# Patient Record
Sex: Female | Born: 1970 | Race: White | Hispanic: No | Smoking: Former smoker
Health system: Southern US, Community
[De-identification: ages and names within clinical notes are randomized; demographics above are authoritative.]

## PROBLEM LIST (undated history)

## (undated) DIAGNOSIS — J40 Bronchitis, not specified as acute or chronic: Secondary | ICD-10-CM

## (undated) DIAGNOSIS — IMO0002 Reserved for concepts with insufficient information to code with codable children: Secondary | ICD-10-CM

## (undated) DIAGNOSIS — M797 Fibromyalgia: Secondary | ICD-10-CM

## (undated) DIAGNOSIS — F410 Panic disorder [episodic paroxysmal anxiety] without agoraphobia: Secondary | ICD-10-CM

## (undated) DIAGNOSIS — K639 Disease of intestine, unspecified: Secondary | ICD-10-CM

## (undated) DIAGNOSIS — G713 Mitochondrial myopathy, not elsewhere classified: Secondary | ICD-10-CM

## (undated) DIAGNOSIS — Z9010 Acquired absence of unspecified breast and nipple: Secondary | ICD-10-CM

## (undated) DIAGNOSIS — F329 Major depressive disorder, single episode, unspecified: Secondary | ICD-10-CM

## (undated) DIAGNOSIS — K219 Gastro-esophageal reflux disease without esophagitis: Secondary | ICD-10-CM

## (undated) DIAGNOSIS — M3320 Polymyositis, organ involvement unspecified: Secondary | ICD-10-CM

## (undated) DIAGNOSIS — F3181 Bipolar II disorder: Secondary | ICD-10-CM

## (undated) DIAGNOSIS — M6282 Rhabdomyolysis: Secondary | ICD-10-CM

## (undated) DIAGNOSIS — S134XXA Sprain of ligaments of cervical spine, initial encounter: Secondary | ICD-10-CM

## (undated) DIAGNOSIS — J45909 Unspecified asthma, uncomplicated: Secondary | ICD-10-CM

## (undated) DIAGNOSIS — I10 Essential (primary) hypertension: Secondary | ICD-10-CM

## (undated) DIAGNOSIS — C50919 Malignant neoplasm of unspecified site of unspecified female breast: Secondary | ICD-10-CM

## (undated) DIAGNOSIS — I200 Unstable angina: Secondary | ICD-10-CM

## (undated) DIAGNOSIS — F3281 Premenstrual dysphoric disorder: Secondary | ICD-10-CM

## (undated) DIAGNOSIS — E669 Obesity, unspecified: Secondary | ICD-10-CM

## (undated) DIAGNOSIS — D649 Anemia, unspecified: Secondary | ICD-10-CM

## (undated) DIAGNOSIS — K589 Irritable bowel syndrome without diarrhea: Secondary | ICD-10-CM

## (undated) DIAGNOSIS — C4491 Basal cell carcinoma of skin, unspecified: Secondary | ICD-10-CM

## (undated) DIAGNOSIS — Z8614 Personal history of Methicillin resistant Staphylococcus aureus infection: Secondary | ICD-10-CM

## (undated) DIAGNOSIS — E785 Hyperlipidemia, unspecified: Secondary | ICD-10-CM

## (undated) DIAGNOSIS — F4310 Post-traumatic stress disorder, unspecified: Secondary | ICD-10-CM

## (undated) DIAGNOSIS — T148XXA Other injury of unspecified body region, initial encounter: Secondary | ICD-10-CM

## (undated) DIAGNOSIS — J45990 Exercise induced bronchospasm: Secondary | ICD-10-CM

## (undated) HISTORY — DX: Major depressive disorder, single episode, unspecified: F32.9

## (undated) HISTORY — DX: Gastro-esophageal reflux disease without esophagitis: K21.9

## (undated) HISTORY — DX: Disease of intestine, unspecified: K63.9

## (undated) HISTORY — DX: Obesity, unspecified: E66.9

## (undated) HISTORY — DX: Post-traumatic stress disorder, unspecified: F43.10

## (undated) HISTORY — DX: Premenstrual dysphoric disorder: F32.81

## (undated) HISTORY — DX: Reserved for concepts with insufficient information to code with codable children: IMO0002

## (undated) HISTORY — DX: Fibromyalgia: M79.7

## (undated) HISTORY — DX: Malignant neoplasm of unspecified site of unspecified female breast: C50.919

## (undated) HISTORY — DX: Bipolar II disorder: F31.81

## (undated) HISTORY — DX: Sprain of ligaments of cervical spine, initial encounter: S13.4XXA

## (undated) HISTORY — PX: MASTECTOMY: SHX3

## (undated) HISTORY — DX: Exercise induced bronchospasm: J45.990

## (undated) HISTORY — DX: Bronchitis, not specified as acute or chronic: J40

## (undated) HISTORY — PX: MUSCLE BIOPSY: SHX716

## (undated) HISTORY — PX: NASAL SINUS SURGERY: SHX719

## (undated) HISTORY — DX: Acquired absence of unspecified breast and nipple: Z90.10

## (undated) HISTORY — DX: Irritable bowel syndrome without diarrhea: K58.9

## (undated) HISTORY — PX: NASAL POLYP SURGERY: SHX186

## (undated) HISTORY — DX: Basal cell carcinoma of skin, unspecified: C44.91

## (undated) HISTORY — DX: Other injury of unspecified body region, initial encounter: T14.8XXA

## (undated) HISTORY — PX: ABDOMINAL HYSTERECTOMY: SHX81

## (undated) HISTORY — DX: Panic disorder (episodic paroxysmal anxiety): F41.0

---
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
## 1991-08-04 DIAGNOSIS — T148XXA Other injury of unspecified body region, initial encounter: Secondary | ICD-10-CM

## 1991-08-04 HISTORY — DX: Other injury of unspecified body region, initial encounter: T14.8XXA

## 1992-08-03 DIAGNOSIS — K639 Disease of intestine, unspecified: Secondary | ICD-10-CM

## 1992-08-03 HISTORY — DX: Disease of intestine, unspecified: K63.9

## 1998-08-03 DIAGNOSIS — J40 Bronchitis, not specified as acute or chronic: Secondary | ICD-10-CM

## 1998-08-03 HISTORY — DX: Bronchitis, not specified as acute or chronic: J40

## 2000-09-03 HISTORY — PX: LAPAROSCOPIC ENDOMETRIOSIS FULGURATION: SUR769

## 2001-08-31 ENCOUNTER — Ambulatory Visit (HOSPITAL_COMMUNITY): Admission: RE | Admit: 2001-08-31 | Discharge: 2001-08-31 | Payer: Self-pay | Admitting: Gastroenterology

## 2002-05-16 ENCOUNTER — Other Ambulatory Visit: Admission: RE | Admit: 2002-05-16 | Discharge: 2002-05-16 | Payer: Self-pay | Admitting: Obstetrics and Gynecology

## 2003-04-26 ENCOUNTER — Other Ambulatory Visit: Admission: RE | Admit: 2003-04-26 | Discharge: 2003-04-26 | Payer: Self-pay | Admitting: Obstetrics & Gynecology

## 2003-08-04 DIAGNOSIS — S134XXA Sprain of ligaments of cervical spine, initial encounter: Secondary | ICD-10-CM

## 2003-08-04 HISTORY — DX: Sprain of ligaments of cervical spine, initial encounter: S13.4XXA

## 2005-06-30 ENCOUNTER — Ambulatory Visit (HOSPITAL_COMMUNITY): Admission: RE | Admit: 2005-06-30 | Discharge: 2005-06-30 | Payer: Self-pay | Admitting: Gynecology

## 2005-07-16 ENCOUNTER — Ambulatory Visit (HOSPITAL_COMMUNITY): Admission: RE | Admit: 2005-07-16 | Discharge: 2005-07-16 | Payer: Self-pay | Admitting: Gynecology

## 2005-08-03 DIAGNOSIS — C4491 Basal cell carcinoma of skin, unspecified: Secondary | ICD-10-CM

## 2005-08-03 HISTORY — DX: Basal cell carcinoma of skin, unspecified: C44.91

## 2005-10-01 ENCOUNTER — Inpatient Hospital Stay (HOSPITAL_COMMUNITY): Admission: AD | Admit: 2005-10-01 | Discharge: 2005-10-01 | Payer: Self-pay | Admitting: Gynecology

## 2005-10-06 ENCOUNTER — Ambulatory Visit: Payer: Self-pay | Admitting: Family Medicine

## 2005-10-20 ENCOUNTER — Ambulatory Visit: Payer: Self-pay | Admitting: Family Medicine

## 2005-11-03 ENCOUNTER — Ambulatory Visit: Payer: Self-pay | Admitting: Family Medicine

## 2005-11-12 ENCOUNTER — Ambulatory Visit: Payer: Self-pay | Admitting: Obstetrics & Gynecology

## 2005-11-19 ENCOUNTER — Ambulatory Visit: Payer: Self-pay | Admitting: Obstetrics & Gynecology

## 2005-11-23 ENCOUNTER — Ambulatory Visit: Payer: Self-pay | Admitting: Gynecology

## 2005-11-26 ENCOUNTER — Ambulatory Visit: Payer: Self-pay | Admitting: Obstetrics & Gynecology

## 2005-12-03 ENCOUNTER — Ambulatory Visit: Payer: Self-pay | Admitting: Obstetrics & Gynecology

## 2005-12-06 ENCOUNTER — Ambulatory Visit: Payer: Self-pay | Admitting: Gynecology

## 2005-12-06 ENCOUNTER — Inpatient Hospital Stay (HOSPITAL_COMMUNITY): Admission: AD | Admit: 2005-12-06 | Discharge: 2005-12-13 | Payer: Self-pay | Admitting: Family Medicine

## 2005-12-08 ENCOUNTER — Encounter (INDEPENDENT_AMBULATORY_CARE_PROVIDER_SITE_OTHER): Payer: Self-pay | Admitting: *Deleted

## 2005-12-17 ENCOUNTER — Ambulatory Visit: Payer: Self-pay | Admitting: Family Medicine

## 2006-01-14 ENCOUNTER — Ambulatory Visit: Payer: Self-pay | Admitting: Obstetrics & Gynecology

## 2006-04-01 ENCOUNTER — Encounter: Payer: Self-pay | Admitting: Internal Medicine

## 2006-04-02 ENCOUNTER — Encounter: Admission: RE | Admit: 2006-04-02 | Discharge: 2006-04-02 | Payer: Self-pay | Admitting: Neurology

## 2006-05-20 ENCOUNTER — Encounter: Payer: Self-pay | Admitting: Internal Medicine

## 2006-05-27 ENCOUNTER — Ambulatory Visit (HOSPITAL_BASED_OUTPATIENT_CLINIC_OR_DEPARTMENT_OTHER): Admission: RE | Admit: 2006-05-27 | Discharge: 2006-05-27 | Payer: Self-pay | Admitting: Rheumatology

## 2006-05-30 ENCOUNTER — Ambulatory Visit: Payer: Self-pay | Admitting: Internal Medicine

## 2006-07-03 ENCOUNTER — Encounter: Admission: RE | Admit: 2006-07-03 | Discharge: 2006-07-03 | Payer: Self-pay | Admitting: Neurology

## 2006-08-03 DIAGNOSIS — IMO0002 Reserved for concepts with insufficient information to code with codable children: Secondary | ICD-10-CM

## 2006-08-03 DIAGNOSIS — K589 Irritable bowel syndrome without diarrhea: Secondary | ICD-10-CM

## 2006-08-03 HISTORY — DX: Irritable bowel syndrome, unspecified: K58.9

## 2006-08-03 HISTORY — PX: COLONOSCOPY: SHX174

## 2006-08-03 HISTORY — DX: Reserved for concepts with insufficient information to code with codable children: IMO0002

## 2006-10-02 ENCOUNTER — Ambulatory Visit: Payer: Self-pay | Admitting: Internal Medicine

## 2006-10-14 ENCOUNTER — Ambulatory Visit: Payer: Self-pay | Admitting: Internal Medicine

## 2006-11-02 ENCOUNTER — Ambulatory Visit: Payer: Self-pay | Admitting: Internal Medicine

## 2006-11-05 ENCOUNTER — Ambulatory Visit: Payer: Self-pay | Admitting: Unknown Physician Specialty

## 2006-12-02 ENCOUNTER — Ambulatory Visit: Payer: Self-pay | Admitting: Internal Medicine

## 2007-05-11 ENCOUNTER — Observation Stay: Payer: Self-pay | Admitting: Internal Medicine

## 2007-05-11 ENCOUNTER — Other Ambulatory Visit: Payer: Self-pay

## 2007-05-12 ENCOUNTER — Encounter: Payer: Self-pay | Admitting: Internal Medicine

## 2007-05-17 ENCOUNTER — Ambulatory Visit: Payer: Self-pay | Admitting: Professional

## 2007-05-24 ENCOUNTER — Ambulatory Visit: Payer: Self-pay | Admitting: Professional

## 2007-05-25 ENCOUNTER — Ambulatory Visit: Payer: Self-pay | Admitting: Internal Medicine

## 2007-05-25 DIAGNOSIS — J45990 Exercise induced bronchospasm: Secondary | ICD-10-CM | POA: Insufficient documentation

## 2007-05-25 DIAGNOSIS — K219 Gastro-esophageal reflux disease without esophagitis: Secondary | ICD-10-CM | POA: Insufficient documentation

## 2007-05-25 DIAGNOSIS — F3289 Other specified depressive episodes: Secondary | ICD-10-CM | POA: Insufficient documentation

## 2007-05-25 DIAGNOSIS — IMO0001 Reserved for inherently not codable concepts without codable children: Secondary | ICD-10-CM | POA: Insufficient documentation

## 2007-05-25 DIAGNOSIS — K589 Irritable bowel syndrome without diarrhea: Secondary | ICD-10-CM | POA: Insufficient documentation

## 2007-05-25 DIAGNOSIS — M797 Fibromyalgia: Secondary | ICD-10-CM | POA: Insufficient documentation

## 2007-05-25 DIAGNOSIS — F411 Generalized anxiety disorder: Secondary | ICD-10-CM | POA: Insufficient documentation

## 2007-05-25 DIAGNOSIS — F329 Major depressive disorder, single episode, unspecified: Secondary | ICD-10-CM | POA: Insufficient documentation

## 2007-05-25 DIAGNOSIS — J309 Allergic rhinitis, unspecified: Secondary | ICD-10-CM | POA: Insufficient documentation

## 2007-05-25 HISTORY — DX: Gastro-esophageal reflux disease without esophagitis: K21.9

## 2007-05-25 HISTORY — DX: Exercise induced bronchospasm: J45.990

## 2007-06-01 ENCOUNTER — Encounter: Payer: Self-pay | Admitting: Internal Medicine

## 2007-06-02 ENCOUNTER — Ambulatory Visit: Payer: Self-pay | Admitting: Professional

## 2007-06-06 ENCOUNTER — Ambulatory Visit: Payer: Self-pay | Admitting: Professional

## 2007-06-23 ENCOUNTER — Ambulatory Visit: Payer: Self-pay | Admitting: Professional

## 2007-06-27 ENCOUNTER — Ambulatory Visit: Payer: Self-pay | Admitting: Internal Medicine

## 2007-06-27 DIAGNOSIS — J019 Acute sinusitis, unspecified: Secondary | ICD-10-CM | POA: Insufficient documentation

## 2007-07-01 ENCOUNTER — Telehealth: Payer: Self-pay | Admitting: Family Medicine

## 2007-07-07 ENCOUNTER — Ambulatory Visit: Payer: Self-pay | Admitting: Professional

## 2007-08-03 ENCOUNTER — Ambulatory Visit: Payer: Self-pay | Admitting: Family Medicine

## 2007-08-03 DIAGNOSIS — K1370 Unspecified lesions of oral mucosa: Secondary | ICD-10-CM | POA: Insufficient documentation

## 2007-08-15 ENCOUNTER — Telehealth: Payer: Self-pay | Admitting: Internal Medicine

## 2007-08-31 ENCOUNTER — Telehealth (INDEPENDENT_AMBULATORY_CARE_PROVIDER_SITE_OTHER): Payer: Self-pay | Admitting: *Deleted

## 2008-06-03 LAB — HM PAP SMEAR: HM Pap smear: NORMAL

## 2008-10-01 LAB — HM MAMMOGRAPHY: HM Mammogram: NORMAL

## 2010-03-18 ENCOUNTER — Emergency Department: Payer: Self-pay | Admitting: Emergency Medicine

## 2010-08-03 DIAGNOSIS — F3181 Bipolar II disorder: Secondary | ICD-10-CM

## 2010-08-03 HISTORY — DX: Bipolar II disorder: F31.81

## 2010-08-23 ENCOUNTER — Encounter: Payer: Self-pay | Admitting: Gynecology

## 2010-09-04 ENCOUNTER — Ambulatory Visit: Payer: Self-pay

## 2010-12-19 NOTE — Group Therapy Note (Signed)
NAME:  [name], DADDONA NO.:  [id number]   MEDICAL RECORD NO.:  [id number]          PATIENT TYPE:  WOC   LOCATION:  [facility]                   FACILITY:  [facility]   PHYSICIAN:  Elsie Lincoln, MD      DATE OF BIRTH:  04/19/1971   DATE OF SERVICE:  01/14/2006                                    CLINIC NOTE   HISTORY OF PRESENT ILLNESS:  The patient is a 40 year old female who  presents for a post partum exam.  The patient had a c-section for failure to  progress with possible placenta abruption.  The patient had a somewhat  tumultuous postoperative course with fevers however did not ever have an  abscess.  The patient did have a negative CT of the abdomen.  There was a  large anxiety component with this patient and we are still dealing with  this.  We are very supportive of her needs.  In post partum she has done well.  She has a little bit of residual leg  numbness most likely from being in stirrups or cutting a nerve during the c-  section.  The patient is not breast feeding and has not had sexual  intercourse yet.  The patient would like to resume sexual intercourse.  We  did talk about birth control and she would like to try a pill, lowest does  of estrogen possible.  She also is interested in having another baby.  I  have advised her to wait at least 1 year.  She is not having problem with  post partum depression.  She has scant brownish discharge.  She is  requesting Xanax that she needs the week before her menses.  We will honor  this request.  She says that antidepressants are not effective for her for  PMDD.   PHYSICAL EXAMINATION:  VITAL SIGNS:  Pulse 73, blood pressure 117/69, weight  145.  GENERAL:  Well-nourished, well-developed in no apparent distress.  LUNGS:  Clear to auscultation.  HEART:  Regular rate and rhythm.  ABDOMEN:  Soft, non-tender, non-distended.  Pfannenstiel skin incision well  healed.  GENITALIA:  __________, pink normal rugae.  Vagina,  cervix closed, non-  tender.  Uterus mobile, non-tender.  Adnexa no masses, non-tender.  EXTREMITIES:  Non-tender.   ASSESSMENT AND PLAN:  A 40 year old well female post partum exam.  1.  Loestrin Fe 24.  2.  Check CBC as the patient had anemia postoperatively.  We will see if she      can stop taking daily supplemental iron and Colace.  3.  Xanax 0.5 mg 10 pills a month, Rx given.  4.  Return to the office in October for pap smear.           ______________________________  Elsie Lincoln, MD     KL/MEDQ  D:  01/14/2006  T:  01/14/2006  Job:  [id number]

## 2010-12-19 NOTE — Op Note (Signed)
[name], [name]                  ACCOUNT NO.:  [id number]   MEDICAL RECORD NO.:  [id number]          PATIENT TYPE:  INP   LOCATION:  [facility]                           [facility]   PHYSICIAN:  Ginger Carne, MD  DATE OF BIRTH:  [birth year]   DATE OF PROCEDURE:  12/08/2005  DATE OF DISCHARGE:                                 OPERATIVE REPORT   PREOPERATIVE DIAGNOSIS:  Nonreassuring fetal heart, post dates.   POSTOPERATIVE DIAGNOSIS:  1.  Nonreassuring fetal heart, post dates.  2.  Term viable delivery of female infant.  3.  Abruptio placenta.   PROCEDURE:  Primary low transverse cesarean section.   SURGEON:  Ginger Carne, M.D.   ASSISTANT:  None.   COMPLICATIONS:  Intraoperative uterine atony.   ESTIMATED BLOOD LOSS:  750 cc.   SPECIMEN:  Cord bloods, cord pH and placenta.   ANESTHESIA:  Epidural.   OPERATIVE FINDINGS:  Term infant female delivered at 1916 hours on  May8,2007, Apgar 8/9, weight 8 pounds 0 ounces, cord pH 7.30. The patient  had developed intraoperative uterine atony and had to be controlled with 40  units of Pitocin per liter, vigorous massage, Methergine 0.2 mg  intramuscularly, Cytotec 1000 mcg per rectum and a meatloaf serial suturing  of the uterus with zero Vicryl. Twenty percent abruptio of the placenta  noted on the centripetal portion of the placenta, three-vessel cord, central  insertion. Uterus, tubes and ovaries showed normal residual changes of  pregnancy.  Amniotic fluid was meconium stained (thick), NICU staff present.   OPERATIVE PROCEDURE:  The patient prepped and draped in the usual fashion  and placed in left lateral supine position.  Betadine solution was used for  antiseptic, and the patient was catheterized prior to procedure.  After  adequate epidural analgesia, Pfannenstiel incision was made and the abdomen  opened.  The lower uterine segment was incised transversely after developing  the bladder flap. Baby delivered, cord  clamped and cut, and infant given to  the pediatric staff after bulb suctioning.  Placenta removed manually.  Uterus inspected. Closure of the uterine musculature with 0 Vicryl running  interlocking suture.  As aforementioned, medical management of bleeding did  not control significant uterine atony. At this point, a meatloaf suturing  technique incorporating 0 Vicryl suture with a series of laterally placed  sutures entering the uterine musculature anteriorly, exiting posteriorly,  then going across the uterus, entering again on the lateral side of the  uterus posteriorly and exiting anteriorly 2 cm vertical distance.  Afterwards, the uterus felt firm, replaced, blood clots removed, bleeding  points hemostatically checked. Closure of  the parietal peritoneum with 2-0 Vicryl suture, with 0 Vicryl suture running  for the fascia, 0 Vicryl for subcutaneous layer and 4-0 Prolene for  subcuticular closure. Instruments and sponge counts were correct.  The  patient tolerated the procedure well and returned to the post anesthesia  recovery room in excellent condition.      Ginger Carne, MD  Electronically Signed     SHB/MEDQ  D:  12/08/2005  T:  12/09/2005  Job:  [id number]

## 2010-12-19 NOTE — Discharge Summary (Signed)
[name], [name]                  ACCOUNT NO.:  [id number]   MEDICAL RECORD NO.:  [id number]          PATIENT TYPE:  INP   LOCATION:  9118                          [facility]   PHYSICIAN:  Tanya S. Shawnie Pons, M.D.   DATE OF BIRTH:  07-14-1971   DATE OF ADMISSION:  12/06/2005  DATE OF DISCHARGE:  12/13/2005                                 DISCHARGE SUMMARY   FINAL DIAGNOSES:  1.  Term pregnancy, delivered.  2.  Arrest of descent.  3.  Endomyometritis.  4.  Asthma.  5.  Hiatal hernia.  6.  Abruption of placenta.   PERTINENT LABORATORY DATA:  Preoperative hemoglobin 12.4 and an initial  white count of 14.2.  Platelets were stable.  Postoperative hemoglobin down  to 7.6, up to 8.2 at the time of discharge.  Postoperative white count up to  a high of 24, down to 13.2 at the time of discharge.  She had normal  electrolytes, including creatinine and LFT function.  She had an RPR that  was nonreactive.  The patient also underwent CT scanning which was  essentially normal, except for postoperative changes.   HOSPITAL COURSE:  The patient was admitted for induction of labor.  She  underwent induction with Cervidil over the first night.  She then was  started on Pitocin the next day and had very slow progress of labor until  hospital day #2 when she finally began changing her cervix.  She got fully  dilated at approximately 5 o'clock and pushed for an hour and a half with no  significant descent.  She also started to develop some variables and  abnormal bleeding at the end and was taken for a C-section.  C-section  revealed a 20% abruption but normal fetus with a normal cord pH of 7.3.  In  the OR, the patient had some uterine atony.  She was given Methergine,  Hemabate, and Cytotec and actually had to have a meatloaf stitch through the  uterus to help achieve uterine tone.   Postoperatively, she was transferred to the floor.  She was started on  gentamycin and clindamycin for fever  during labor and postoperatively;  however, she was unable to tolerate those antibiotics secondary to nausea.  The patient then on postoperative day #1 spiked a temperature to 101.2 and  was changed to cefepime, and this was continued on for the next three days.  Flagyl was added on postoperative day #3 to increase anaerobic coverage,  with a decrease in tenderness of her uterus, decreasing white count, and  decreasing temperature curve.   The patient continued to have a lot of anxiety and uterine tenderness in her  abdomen.  She also had lost a lot of blood and was pale in appearance.  Given all of these things, the family requested a CT scan to rule out  another problem prior to discharge, and this was done on the day prior to  discharge.  The CT showed no significant abnormalities.  This still ought to  reassure the patient that she was okay, and on postoperative  day #5 after  she had been afebrile for 48 hours, she desired to go home.  At this point,  IV antibiotics will be stopped, and she will be sent home on no medications.   Her incision continued to look clean and dry throughout the hospital course.  She does have a subcuticular stitch still running through the incision.  This will be removed after discharge.   The patient had some numbness in her leg that was felt to be possibly be  related to being in stirrups.  This will be watched for a few weeks, and if  it continues to be a problem, a neurology referral would be appropriate.   CONDITION ON DISCHARGE:  The patient is discharged home in good condition.   FOLLOW UP:  Followup will be at the [facility] office on Thursday for  suture removal and then a six-week followup after that.   DISCHARGE MEDICATIONS:  Resumption of her home medications, including  albuterol, Prevacid, Zyrtec, and Colace.  In addition, I have written her  for Chromagen to be started approximately a week after discharge secondary  to constipating  effects, Dilaudid 2 mg one to two every four to six hours as  needed, and Darvocet one to two every four to six hours as needed.   DISCHARGE INSTRUCTIONS:  She should call the [facility] office if she is  having any problems or she can call and ask a nurse if it is after hours.  All of these instructions were given to the patient prior to letting her go.           ______________________________  Shelbie Proctor. Shawnie Pons, M.D.     TSP/MEDQ  D:  12/13/2005  T:  12/14/2005  Job:  [id number]

## 2010-12-19 NOTE — Procedures (Signed)
NAME:  [name], [name]                  ACCOUNT NO.:  [id number]   MEDICAL RECORD NO.:  [id number]          PATIENT TYPE:  OUT   LOCATION:  S[facility] CENTER                 FACILITY:  [facility]   PHYSICIAN:  Clinton D. Maple Hudson, MD, FCCP, FACPDATE OF BIRTH:  09/[birth year]/1970   DATE OF STUDY:  05/27/2006                              NOCTURNAL POLYSOMNOGRAM   REFERRING PHYSICIAN:  Dr. Stacey Drain   INDICATION FOR STUDY:  Insomnia with sleep apnea.   EPWORTH SLEEPINESS SCORE:  17/24. BMI 25.5, weight 144 pounds.   HOME MEDICATIONS:  Zyrtec, Nasonex, Xanax, Darvocet, Prevacid, albuterol,  Skelaxin.   SLEEP ARCHITECTURE:  Total sleep time 391 minutes with sleep efficiency 95%.  Stage 1 was 1%, stage 2 52%, stages 3 and 4 24%, REM 22% of total sleep  time. Sleep latency 14 minutes, REM latency 135 minutes. Awake after sleep  onset 8 minutes. Arousal index 6.6. No bedtime medication was reported.   RESPIRATORY DATA:  Apnea-hypopnea index (AHI, RDI 1.7 obstructive events per  hour which is within normal limits). Normal range is 0 to 5 per hour. There  were 8 obstructive apneas and 3 hypopneas. All events were not positional  recorded more commonly while sleeping supine, but I have seen in other  positions as well. REM AHI 1.4 per hour. There were insufficient events to  qualify for CPAP titration by split protocol on this study night.   OXYGEN DATA:  Mild snoring intermittently with oxygen desaturation to a  nadir of 92%. Mean oxygen saturation through the study was 97% on room air.   CARDIAC DATA:  Normal sinus rhythm.   MOVEMENT-PARASOMNIA:  Occasional limb jerk with little effect on sleep.   IMPRESSIONS/RECOMMENDATIONS:  1. Unremarkable sleep architecture for the sleep center environment with      adequate sleep time and normal representation of sleep stages, somewhat      higher than expected percentage of time spent in stages 3 and 4 for an      adult. May reflect reduced amounts of time spent in  this sleep stage in      recent nights prior to the sleep study. Clinical significance is      unclear.  2. Occasional sleep disorder breathing events. AHI 1.7 per hour (normal      range 0-5 per hour). No specific therapy to recommend except it may      help to have her sleep off the side of her back.      Clinton D. Maple Hudson, MD, [facility], FACP  Diplomate, Biomedical engineer of Sleep Medicine  Electronically Signed     CDY/MEDQ  D:  05/30/2006 14:41:18  T:  05/31/2006 14:48:51  Job:  [id number]   cc:   Aundra Dubin, M.D.  [address]  [place]  [place] [address]

## 2011-03-04 ENCOUNTER — Ambulatory Visit (HOSPITAL_COMMUNITY): Payer: Self-pay | Admitting: Psychiatry

## 2011-03-17 ENCOUNTER — Encounter: Payer: Self-pay | Admitting: Internal Medicine

## 2011-03-30 ENCOUNTER — Encounter: Payer: Self-pay | Admitting: Internal Medicine

## 2011-03-30 ENCOUNTER — Ambulatory Visit (INDEPENDENT_AMBULATORY_CARE_PROVIDER_SITE_OTHER): Payer: Managed Care, Other (non HMO) | Admitting: Internal Medicine

## 2011-03-30 VITALS — BP 120/86 | HR 86 | Temp 99.0°F | Resp 16 | Ht 63.0 in | Wt 149.2 lb

## 2011-03-30 DIAGNOSIS — F329 Major depressive disorder, single episode, unspecified: Secondary | ICD-10-CM

## 2011-03-30 DIAGNOSIS — IMO0001 Reserved for inherently not codable concepts without codable children: Secondary | ICD-10-CM

## 2011-03-30 DIAGNOSIS — K5909 Other constipation: Secondary | ICD-10-CM

## 2011-03-30 DIAGNOSIS — F3289 Other specified depressive episodes: Secondary | ICD-10-CM

## 2011-03-30 DIAGNOSIS — M797 Fibromyalgia: Secondary | ICD-10-CM

## 2011-03-30 DIAGNOSIS — K5904 Chronic idiopathic constipation: Secondary | ICD-10-CM

## 2011-03-30 DIAGNOSIS — E569 Vitamin deficiency, unspecified: Secondary | ICD-10-CM

## 2011-03-30 NOTE — Assessment & Plan Note (Addendum)
She has episodes of increased pain which have kept her out of work  despite use of cymbalta, tylenol with codeine, and  Dlexeril.  Workup for autoimmune/inflammatory arthritis requested and underway.  Referral to rheumatology recommended but patient would like to defer for now.  She has requested a note for the owrk she missed last week and a disability form for the days she missed, which I have completed.

## 2011-03-30 NOTE — Assessment & Plan Note (Signed)
Currently controlled with medications.  NO changes today

## 2011-03-30 NOTE — Progress Notes (Signed)
  Subjective:    Patient ID: [name], female    DOB: 17-Jun-1971, 40 y.o.   MRN: [id number]  HPI 40 yr old white female with history of fibromyalgia, PTSD /anxiety secondary to father's death, presents with increased fatigue and myalgias.  She missed 4 days of work last week due to due increased pain and fatigue symptoms.  She is having pain in both knees and lower back, as well as myalgias in her arms shoulders and legs. .  Concerns about having SLE based on online reading she has done and history of malar rash, LE edema and arthralgias.     Review of Systems  Constitutional: Positive for fatigue.  HENT: Positive for neck pain.   Cardiovascular: Negative for chest pain and leg swelling.  Gastrointestinal: Negative for abdominal pain.  Musculoskeletal: Positive for myalgias, back pain and arthralgias.  Psychiatric/Behavioral: Positive for sleep disturbance and decreased concentration. The patient is nervous/anxious.        Objective:   Physical Exam  Constitutional: She appears well-developed and well-nourished.  Eyes: EOM are normal. Pupils are equal, round, and reactive to light.  Neck: Normal range of motion. Neck supple. No thyromegaly present.  Cardiovascular: Normal rate, regular rhythm and normal heart sounds.   No murmur heard. Pulmonary/Chest: Effort normal and breath sounds normal.  Abdominal: Soft. Bowel sounds are normal. There is no tenderness.  Musculoskeletal: Normal range of motion. She exhibits tenderness.       Right shoulder: She exhibits tenderness.       Left shoulder: She exhibits tenderness and spasm.       Right knee: She exhibits no swelling and no effusion.       Left knee: She exhibits no swelling, no effusion and no erythema.       Legs: Lymphadenopathy:    She has no cervical adenopathy.  Psychiatric: Her speech is normal and behavior is normal. Judgment and thought content normal. She exhibits a depressed mood.          Assessment & Plan:

## 2011-03-31 LAB — CBC WITH DIFFERENTIAL/PLATELET
Basophils Absolute: 0 10*3/uL (ref 0.0–0.1)
Basophils Relative: 0.2 % (ref 0.0–3.0)
Eosinophils Absolute: 0.2 10*3/uL (ref 0.0–0.7)
Lymphocytes Relative: 23.2 % (ref 12.0–46.0)
MCHC: 32.8 g/dL (ref 30.0–36.0)
Neutrophils Relative %: 69.1 % (ref 43.0–77.0)
RBC: 4.01 Mil/uL (ref 3.87–5.11)
RDW: 12.8 % (ref 11.5–14.6)

## 2011-03-31 LAB — RHEUMATOID FACTOR: Rhuematoid fact SerPl-aCnc: <10 IU/mL (ref <=14)

## 2011-03-31 LAB — VITAMIN D 25 HYDROXY (VIT D DEFICIENCY, FRACTURES): Vit D, 25-Hydroxy: 26 ng/mL — ABNORMAL LOW (ref 30–89)

## 2011-03-31 LAB — TSH: TSH: 0.28 u[IU]/mL — ABNORMAL LOW (ref 0.35–5.50)

## 2011-05-01 ENCOUNTER — Emergency Department: Payer: Self-pay | Admitting: *Deleted

## 2011-05-13 ENCOUNTER — Ambulatory Visit (INDEPENDENT_AMBULATORY_CARE_PROVIDER_SITE_OTHER): Payer: Managed Care, Other (non HMO) | Admitting: Internal Medicine

## 2011-05-13 ENCOUNTER — Encounter: Payer: Self-pay | Admitting: Internal Medicine

## 2011-05-13 DIAGNOSIS — F3289 Other specified depressive episodes: Secondary | ICD-10-CM

## 2011-05-13 DIAGNOSIS — F329 Major depressive disorder, single episode, unspecified: Secondary | ICD-10-CM

## 2011-05-13 DIAGNOSIS — F4310 Post-traumatic stress disorder, unspecified: Secondary | ICD-10-CM

## 2011-05-13 DIAGNOSIS — Z0279 Encounter for issue of other medical certificate: Secondary | ICD-10-CM
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
## 2011-05-15 DIAGNOSIS — F4310 Post-traumatic stress disorder, unspecified: Secondary | ICD-10-CM

## 2011-05-15 HISTORY — DX: Post-traumatic stress disorder, unspecified: F43.10

## 2011-05-16 NOTE — Assessment & Plan Note (Signed)
She has been unable to work due to frequent panic attacks and excessive fatigue and anhedonia.  Her concentration is poor, she has no energy, and is prone to frequent crying spells.  She is finally able to commit to getting psychiatric help and counselling.  She is requesting FMLA for 3 months while she seeks psychiatric help, and I have filled out the designated forms in support of a 3 month break.

## 2011-05-25 ENCOUNTER — Other Ambulatory Visit: Payer: Self-pay | Admitting: Internal Medicine

## 2011-05-25 MED ORDER — CLONAZEPAM 1 MG PO TABS: 1.0000 mg | ORAL_TABLET | Freq: Every day | ORAL | Status: DC

## 2011-05-25 MED ORDER — ACETAMINOPHEN-CODEINE 300-60 MG PO TABS: 1.0000 | ORAL_TABLET | Freq: Four times a day (QID) | ORAL | Status: DC | PRN

## 2011-05-25 NOTE — Telephone Encounter (Signed)
Ok to refill the clonazepam but NOT THE OXYCODONE

## 2011-07-01 ENCOUNTER — Encounter: Payer: Self-pay | Admitting: Internal Medicine

## 2011-07-01 ENCOUNTER — Ambulatory Visit: Payer: Managed Care, Other (non HMO) | Admitting: Internal Medicine

## 2011-07-01 DIAGNOSIS — F410 Panic disorder [episodic paroxysmal anxiety] without agoraphobia: Secondary | ICD-10-CM

## 2011-07-01 DIAGNOSIS — F4310 Post-traumatic stress disorder, unspecified: Secondary | ICD-10-CM | POA: Insufficient documentation

## 2011-07-01 DIAGNOSIS — F319 Bipolar disorder, unspecified: Secondary | ICD-10-CM | POA: Insufficient documentation

## 2011-07-01 HISTORY — DX: Panic disorder (episodic paroxysmal anxiety): F41.0

## 2011-07-10 DIAGNOSIS — Z0279 Encounter for issue of other medical certificate: Secondary | ICD-10-CM

## 2011-07-13 ENCOUNTER — Telehealth: Payer: Self-pay | Admitting: Internal Medicine

## 2011-07-13 MED ORDER — FUROSEMIDE 20 MG PO TABS: 20.0000 mg | ORAL_TABLET | Freq: Every day | ORAL | Status: DC

## 2011-07-13 NOTE — Telephone Encounter (Signed)
If the seroquel is helping her mood and she sants to stay on it ,  I can prescribe a diuretic lasix  20 mg one tablet daily in the morning until I can see  Her #30 no refills

## 2011-07-13 NOTE — Telephone Encounter (Signed)
Patient notified. She does want to stay on the seroquel and try diuretic.

## 2011-07-13 NOTE — Telephone Encounter (Signed)
Patients psychiatrist gave her sequel and it is making her swell , she is taking other medications to try to help ,but it is not helping . He psychiatrist instructed her to call her primary care doctor to see what she should do.

## 2011-07-20 ENCOUNTER — Ambulatory Visit (INDEPENDENT_AMBULATORY_CARE_PROVIDER_SITE_OTHER): Payer: Managed Care, Other (non HMO) | Admitting: Internal Medicine

## 2011-07-20 ENCOUNTER — Encounter: Payer: Self-pay | Admitting: Internal Medicine

## 2011-07-20 DIAGNOSIS — F312 Bipolar disorder, current episode manic severe with psychotic features: Secondary | ICD-10-CM

## 2011-07-20 DIAGNOSIS — F319 Bipolar disorder, unspecified: Secondary | ICD-10-CM

## 2011-07-20 NOTE — Assessment & Plan Note (Signed)
Currently not well managed based on her persistent symptoms of despair, fatigeu and poor concentration.  She is not ready for return to wo yet and will need an extension

## 2011-07-20 NOTE — Progress Notes (Signed)
Subjective:    Patient ID: [name], female    DOB: June 13, 1971, 40 y.o.   MRN: [id number]  HPI  Patient returns with more disability forms to be signed for short term  term benefits.   She is seeing Dr. Jennelle Human who is treating her for for Bipolar 2 disorder with nighttime Seroquel 600 mg and Klonipin.   She missed her most recent appt because of an illness that she and her daughter both had, requiring her daughter to be kept out of school bc she had a fever.  She feels terrible. She is  having nausea,  Frequent crying, panic attacks as she tries to fall asleep,  and weight gain despite working with her trainer.  Cymbalta was stopped, which has made her feel worse .    Past Medical History  Diagnosis Date  . Basal cell carcinoma 2007    Left Breast  . Herniated disc     Degenerative, Lumbar disc  . Asthma     exercise induced  . Fibromyalgia   . Fracture 1993    Left Foot  . GERD (gastroesophageal reflux disease)     hiatal hernia  . Whiplash 2005    secondary to rear end collison  . Panic attack 2008    hospitalized  . IBS (irritable bowel syndrome) 2008    stress induced constipation/diarrhea  . Major depressive disorder 2001, 1996    was out of work for 3 and 6 months  . Cystitis     recurrent, precipitated by sexual intercourse  . Migraine     tension   Current Outpatient Prescriptions on File Prior to Visit  Medication Sig Dispense Refill  . acetaminophen-codeine (TYLENOL #4) 300-60 MG per tablet Take 1 tablet by mouth every 6 (six) hours as needed.  60 tablet  3  . albuterol (PROVENTIL,VENTOLIN) 90 MCG/ACT inhaler Inhale 2 puffs into the lungs 4 (four) times daily as needed.       . ALPRAZolam (XANAX) 0.5 MG tablet Take 0.5 mg by mouth daily as needed.        . calcium carbonate (TUMS - DOSED IN MG ELEMENTAL CALCIUM) 500 MG chewable tablet Chew 1 tablet by mouth at bedtime.        . cetirizine (ZYRTEC) 10 MG tablet Take 10 mg by mouth daily.        . clonazePAM  (KLONOPIN) 1 MG tablet Take 1 tablet (1 mg total) by mouth at bedtime.  30 tablet  3  . cyclobenzaprine (FLEXERIL) 10 MG tablet Take 10 mg by mouth 2 (two) times daily as needed.        . nitrofurantoin, macrocrystal-monohydrate, (MACROBID) 100 MG capsule Take 100 mg by mouth 2 (two) times daily as needed. For dysuria       . norethindrone (MICRONOR,CAMILA,ERRIN) 0.35 MG tablet Take 1 tablet by mouth daily. Birth control       . omeprazole (PRILOSEC) 40 MG capsule Take 40 mg by mouth daily.        . phenazopyridine (PYRIDIUM) 200 MG tablet Take 200 mg by mouth 2 (two) times daily as needed. For dysuria       . Probiotic Product (PROBIOTIC PO) Take by mouth at bedtime.        . DULoxetine (CYMBALTA) 30 MG capsule Take 30 mg by mouth daily.        . fluticasone (FLONASE) 50 MCG/ACT nasal spray Place 2 sprays into the nose daily.        Marland Kitchen  furosemide (LASIX) 20 MG tablet Take 1 tablet (20 mg total) by mouth daily.  30 tablet  0  . QUEtiapine (SEROQUEL) 100 MG tablet Take 100 mg by mouth at bedtime. Take one hour before bedtime.         Review of Systems  Constitutional: Negative for fever, chills and unexpected weight change.  HENT: Negative for hearing loss, ear pain, nosebleeds, congestion, sore throat, facial swelling, rhinorrhea, sneezing, mouth sores, trouble swallowing, neck pain, neck stiffness, voice change, postnasal drip, sinus pressure, tinnitus and ear discharge.   Eyes: Negative for pain, discharge, redness and visual disturbance.  Respiratory: Negative for cough, chest tightness, shortness of breath, wheezing and stridor.   Cardiovascular: Negative for chest pain, palpitations and leg swelling.  Musculoskeletal: Negative for myalgias and arthralgias.  Skin: Negative for color change and rash.  Neurological: Negative for dizziness, weakness, light-headedness and headaches.  Hematological: Negative for adenopathy.  Psychiatric/Behavioral: Positive for sleep disturbance and decreased  concentration. Negative for suicidal ideas, hallucinations, behavioral problems, self-injury and agitation. The patient is nervous/anxious. The patient is not hyperactive.        Objective:   Physical Exam  Constitutional: She is oriented to person, place, and time. She appears well-developed and well-nourished.  HENT:  Mouth/Throat: Oropharynx is clear and moist.  Eyes: EOM are normal. Pupils are equal, round, and reactive to light. No scleral icterus.  Neck: Normal range of motion. Neck supple. No JVD present. No thyromegaly present.  Cardiovascular: Normal rate, regular rhythm, normal heart sounds and intact distal pulses.   Pulmonary/Chest: Effort normal and breath sounds normal.  Abdominal: Soft. Bowel sounds are normal. She exhibits no mass. There is no tenderness.  Musculoskeletal: Normal range of motion. She exhibits no edema.  Lymphadenopathy:    She has no cervical adenopathy.  Neurological: She is alert and oriented to person, place, and time.  Skin: Skin is warm and dry.  Psychiatric: Her speech is normal and behavior is normal. Judgment and thought content normal. Cognition and memory are normal. She exhibits a depressed mood.       Affect flat,       Assessment & Plan:

## 2011-08-04 DIAGNOSIS — C50919 Malignant neoplasm of unspecified site of unspecified female breast: Secondary | ICD-10-CM

## 2011-08-04 HISTORY — DX: Malignant neoplasm of unspecified site of unspecified female breast: C50.919

## 2011-08-04 HISTORY — PX: BREAST LUMPECTOMY: SHX2

## 2011-08-04 HISTORY — PX: BILATERAL SALPINGOOPHORECTOMY: SHX1223

## 2011-08-04 HISTORY — PX: VAGINAL HYSTERECTOMY: SHX2639

## 2011-08-04 HISTORY — PX: LAPAROSCOPIC SUPRACERVICAL HYSTERECTOMY: SUR797

## 2011-08-04 HISTORY — PX: CYSTOSCOPY: SUR368

## 2011-08-13 ENCOUNTER — Ambulatory Visit: Payer: Managed Care, Other (non HMO) | Admitting: Family Medicine

## 2011-08-20 ENCOUNTER — Other Ambulatory Visit: Payer: Self-pay | Admitting: Internal Medicine

## 2011-08-24 ENCOUNTER — Other Ambulatory Visit: Payer: Self-pay | Admitting: Internal Medicine

## 2011-08-24 ENCOUNTER — Ambulatory Visit: Payer: Managed Care, Other (non HMO) | Admitting: Internal Medicine

## 2011-08-24 NOTE — Telephone Encounter (Signed)
Done

## 2011-09-08 ENCOUNTER — Other Ambulatory Visit: Payer: Self-pay | Admitting: Internal Medicine

## 2011-09-10 ENCOUNTER — Ambulatory Visit: Payer: Self-pay

## 2011-09-11 ENCOUNTER — Ambulatory Visit: Payer: Self-pay

## 2011-09-14 ENCOUNTER — Telehealth: Payer: Self-pay | Admitting: *Deleted

## 2011-09-14 NOTE — Telephone Encounter (Signed)
Pt has dropped off wellness form for her insurance for your signature.  Form is in red folder.

## 2011-09-14 NOTE — Telephone Encounter (Signed)
I havre reviewed her recent health screening documents from her employer.  Her recent screening labs indicate she has elevated cholesterol and is overweight but should be able to improve both with diet and exercise,  LDL is currenlty 176, goal is < 130.  Goal weight los is 4 lbs/month for total of 24 lbs in  6 months

## 2011-09-15 ENCOUNTER — Other Ambulatory Visit: Payer: Self-pay | Admitting: *Deleted

## 2011-09-15 MED ORDER — ALPRAZOLAM 0.5 MG PO TABS: 0.5000 mg | ORAL_TABLET | Freq: Every day | ORAL | Status: DC | PRN

## 2011-09-15 MED ORDER — ACETAMINOPHEN-CODEINE 300-60 MG PO TABS: 1.0000 | ORAL_TABLET | Freq: Four times a day (QID) | ORAL | Status: DC | PRN

## 2011-09-15 NOTE — Telephone Encounter (Signed)
Form faxed to insurance company, pt advised and instructed as below.

## 2011-09-16 NOTE — Telephone Encounter (Signed)
Meds called to [employer] [facility].

## 2011-09-21 ENCOUNTER — Ambulatory Visit: Payer: Self-pay | Admitting: General Surgery

## 2011-09-28 ENCOUNTER — Ambulatory Visit (INDEPENDENT_AMBULATORY_CARE_PROVIDER_SITE_OTHER): Payer: Managed Care, Other (non HMO) | Admitting: Internal Medicine

## 2011-09-28 ENCOUNTER — Encounter: Payer: Self-pay | Admitting: Internal Medicine

## 2011-09-28 DIAGNOSIS — F4310 Post-traumatic stress disorder, unspecified: Secondary | ICD-10-CM

## 2011-09-28 DIAGNOSIS — IMO0001 Reserved for inherently not codable concepts without codable children: Secondary | ICD-10-CM

## 2011-09-28 DIAGNOSIS — F329 Major depressive disorder, single episode, unspecified: Secondary | ICD-10-CM

## 2011-09-28 DIAGNOSIS — D0590 Unspecified type of carcinoma in situ of unspecified breast: Secondary | ICD-10-CM

## 2011-09-28 DIAGNOSIS — F3289 Other specified depressive episodes: Secondary | ICD-10-CM

## 2011-09-28 DIAGNOSIS — D0510 Intraductal carcinoma in situ of unspecified breast: Secondary | ICD-10-CM

## 2011-09-28 NOTE — Assessment & Plan Note (Signed)
finallu under control with zoloft and clonzaepam.  She has returned to work .

## 2011-09-28 NOTE — Assessment & Plan Note (Signed)
Letter written

## 2011-09-28 NOTE — Assessment & Plan Note (Signed)
Om[proved with use of zoloft and cloanzepam  Managed by [facility].  All antisphyxotics stopped

## 2011-09-28 NOTE — Assessment & Plan Note (Signed)
Diagnosed recently by biopsy, managed by [facility].  She is sceuled for genetic testing and subsequent mastectomy

## 2011-09-28 NOTE — Progress Notes (Signed)
Subjective:    Patient ID: [name], female    DOB: [birth year], 41 y.o.   MRN: [id number]  HPI  Ms. Shoberg is a 41 yr old white female with a history of PTSD, fibromyalgia, and generalized anxiety disorder who returns for follow up on chronic conditions. Has gone back to work.  Now taking 100 mg zoloft and 1.5 mg of clonazepam at night . After trials of several antipsychotics were tried and stopped due to weight gain of 30 lbs and other adverse effects that "almost killed her."  , .  Sees Dr. Jennelle Human next week.  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  Being managed by [facility],  Going back for genetic testing..  Not using her personal trainer bc her fibromyalgia is preventing her from working out,  Wants a letter to get her memebership /gym fees refunded..     Past Medical History  Diagnosis Date  . Basal cell carcinoma 2007    Left Breast  . Herniated disc     Degenerative, Lumbar disc  . Asthma     exercise induced  . Fibromyalgia   . Fracture 1993    Left Foot  . GERD (gastroesophageal reflux disease)     hiatal hernia  . Whiplash 2005    secondary to rear end collison  . Panic attack 2008    hospitalized  . IBS (irritable bowel syndrome) 2008    stress induced constipation/diarrhea  . Major depressive disorder 2001, 1996    was out of work for 3 and 6 months  . Cystitis     recurrent, precipitated by sexual intercourse  . Migraine     tension   Current Outpatient Prescriptions on File Prior to Visit  Medication Sig Dispense Refill  . acetaminophen-codeine (TYLENOL #4) 300-60 MG per tablet Take 1 tablet by mouth every 6 (six) hours as needed.  60 tablet  3  . albuterol (PROVENTIL,VENTOLIN) 90 MCG/ACT inhaler Inhale 2 puffs into the lungs 4 (four) times daily as needed.       . ALPRAZolam (XANAX) 0.5 MG tablet Take 1 tablet (0.5 mg total) by mouth daily as needed.  30 tablet  1  . calcium carbonate (TUMS -  DOSED IN MG ELEMENTAL CALCIUM) 500 MG chewable tablet Chew 1 tablet by mouth at bedtime.        . cetirizine (ZYRTEC) 10 MG tablet Take 10 mg by mouth daily.        . cyclobenzaprine (FLEXERIL) 10 MG tablet TAKE 1 TABLET BY MOUTH TWICE A DAY AS NEEDED FOR MUSCLE SPASM  60 tablet  5  . nitrofurantoin, macrocrystal-monohydrate, (MACROBID) 100 MG capsule Take 100 mg by mouth 2 (two) times daily as needed. For dysuria       . norethindrone (MICRONOR,CAMILA,ERRIN) 0.35 MG tablet Take 1 tablet by mouth daily. Birth control       . omeprazole (PRILOSEC) 40 MG capsule TAKE 1 CAPSULE BY MOUTH EVERY DAY  30 capsule  10  . phenazopyridine (PYRIDIUM) 200 MG tablet Take 200 mg by mouth 2 (two) times daily as needed. For dysuria       . Probiotic Product (PROBIOTIC PO) Take by mouth at bedtime.         Review of Systems  Constitutional: Negative for fever, chills and unexpected weight change.  HENT: Negative for hearing loss, ear pain, nosebleeds, congestion, sore throat, facial swelling, rhinorrhea, sneezing, mouth sores, trouble  swallowing, neck pain, neck stiffness, voice change, postnasal drip, sinus pressure, tinnitus and ear discharge.   Eyes: Negative for pain, discharge, redness and visual disturbance.  Respiratory: Negative for cough, chest tightness, shortness of breath, wheezing and stridor.   Cardiovascular: Negative for chest pain, palpitations and leg swelling.  Musculoskeletal: Negative for myalgias and arthralgias.  Skin: Negative for color change and rash.  Neurological: Negative for dizziness, weakness, light-headedness and headaches.  Hematological: Negative for adenopathy.       Objective:   Physical Exam  Constitutional: She is oriented to person, place, and time. She appears well-developed and well-nourished.  HENT:  Mouth/Throat: Oropharynx is clear and moist.  Eyes: EOM are normal. Pupils are equal, round, and reactive to light. No scleral icterus.  Neck: Normal range of motion.  Neck supple. No JVD present. No thyromegaly present.  Cardiovascular: Normal rate, regular rhythm, normal heart sounds and intact distal pulses.   Pulmonary/Chest: Effort normal and breath sounds normal.  Abdominal: Soft. Bowel sounds are normal. She exhibits distension. She exhibits no mass. There is no tenderness.  Musculoskeletal: Normal range of motion. She exhibits tenderness. She exhibits no edema.  Lymphadenopathy:    She has no cervical adenopathy.  Neurological: She is alert and oriented to person, place, and time.  Skin: Skin is warm and dry.  Psychiatric: She has a normal mood and affect.      Assessment & Plan:   DEPRESSION Om[proved with use of zoloft and cloanzepam  Managed by [facility].  All antisphyxotics stopped  FIBROMYALGIA Letter written.  PTSD (post-traumatic stress disorder) finallu under control with zoloft and clonzaepam.  She has returned to work .  Ductal carcinoma in situ of breast Diagnosed recently by biopsy, managed by [facility].  She is sceuled for genetic testing and subsequent mastectomy    Updated Medication List Outpatient Encounter Prescriptions as of 09/28/2011  Medication Sig Dispense Refill  . acetaminophen-codeine (TYLENOL #4) 300-60 MG per tablet Take 1 tablet by mouth every 6 (six) hours as needed.  60 tablet  3  . albuterol (PROVENTIL,VENTOLIN) 90 MCG/ACT inhaler Inhale 2 puffs into the lungs 4 (four) times daily as needed.       . ALPRAZolam (XANAX) 0.5 MG tablet Take 1 tablet (0.5 mg total) by mouth daily as needed.  30 tablet  1  . Beclomethasone Dipropionate (QNASL) 80 MCG/ACT AERS Place into the nose.      . calcium carbonate (TUMS - DOSED IN MG ELEMENTAL CALCIUM) 500 MG chewable tablet Chew 1 tablet by mouth at bedtime.        . cetirizine (ZYRTEC) 10 MG tablet Take 10 mg by mouth daily.        . clonazePAM (KLONOPIN) 1 MG tablet Take 1.5 mg by mouth at bedtime.      . cyclobenzaprine (FLEXERIL) 10 MG tablet TAKE 1 TABLET BY MOUTH  TWICE A DAY AS NEEDED FOR MUSCLE SPASM  60 tablet  5  . nitrofurantoin, macrocrystal-monohydrate, (MACROBID) 100 MG capsule Take 100 mg by mouth 2 (two) times daily as needed. For dysuria       . norethindrone (MICRONOR,CAMILA,ERRIN) 0.35 MG tablet Take 1 tablet by mouth daily. Birth control       . omeprazole (PRILOSEC) 40 MG capsule TAKE 1 CAPSULE BY MOUTH EVERY DAY  30 capsule  10  . phenazopyridine (PYRIDIUM) 200 MG tablet Take 200 mg by mouth 2 (two) times daily as needed. For dysuria       . Probiotic Product (  PROBIOTIC PO) Take by mouth at bedtime.        . sertraline (ZOLOFT) 100 MG tablet Take 100 mg by mouth daily.      Marland Kitchen DISCONTD: clonazePAM (KLONOPIN) 1 MG tablet Take 1 tablet (1 mg total) by mouth at bedtime.  30 tablet  3  . DISCONTD: DULoxetine (CYMBALTA) 30 MG capsule Take 30 mg by mouth daily.        Marland Kitchen DISCONTD: fluticasone (FLONASE) 50 MCG/ACT nasal spray Place 2 sprays into the nose daily.        Marland Kitchen DISCONTD: furosemide (LASIX) 20 MG tablet TAKE 1 TABLET (20 MG TOTAL) BY MOUTH DAILY.  30 tablet  0  . DISCONTD: NON FORMULARY Q-nasl, Use two sprays in each nostril once a day       . DISCONTD: QUEtiapine (SEROQUEL) 100 MG tablet Take 100 mg by mouth at bedtime. Take one hour before bedtime.       Marland Kitchen DISCONTD: QUEtiapine (SEROQUEL) 300 MG tablet Take 600 mg by mouth at bedtime.

## 2011-09-28 NOTE — Patient Instructions (Addendum)
Consider low carb wraps:  Toufayanbrand is  available at [employer],  Joseph's oita bread and flatbread is available at [employer]  And [employer]

## 2011-11-11 ENCOUNTER — Ambulatory Visit: Payer: Self-pay | Admitting: General Surgery

## 2011-11-12 ENCOUNTER — Ambulatory Visit: Payer: Self-pay | Admitting: General Surgery

## 2011-11-12 LAB — HEMOGLOBIN: HGB: 11.6 g/dL — ABNORMAL LOW (ref 12.0–16.0)

## 2011-11-18 ENCOUNTER — Ambulatory Visit: Payer: Self-pay | Admitting: Radiation Oncology

## 2011-11-19 LAB — PATHOLOGY REPORT

## 2011-11-23 ENCOUNTER — Other Ambulatory Visit: Payer: Self-pay | Admitting: Internal Medicine

## 2011-11-23 MED ORDER — ALPRAZOLAM 0.5 MG PO TABS: 0.5000 mg | ORAL_TABLET | Freq: Every day | ORAL | Status: DC | PRN

## 2011-11-30 ENCOUNTER — Ambulatory Visit: Payer: Self-pay | Admitting: Urology

## 2011-12-02 ENCOUNTER — Ambulatory Visit: Payer: Self-pay | Admitting: Radiation Oncology

## 2011-12-17 LAB — CBC CANCER CENTER
Basophil #: 0 x10 3/mm (ref 0.0–0.1)
Basophil %: 0.5 %
HCT: 36.2 % (ref 35.0–47.0)
MCH: 28.6 pg (ref 26.0–34.0)
MCHC: 32.5 g/dL (ref 32.0–36.0)
MCV: 88 fL (ref 80–100)
Monocyte %: 7.6 %
Neutrophil #: 4.8 x10 3/mm (ref 1.4–6.5)
Neutrophil %: 62.8 %
Platelet: 375 x10 3/mm (ref 150–440)
WBC: 7.6 x10 3/mm (ref 3.6–11.0)

## 2011-12-24 LAB — CBC CANCER CENTER
Basophil #: 0 x10 3/mm (ref 0.0–0.1)
Basophil %: 0.4 %
Eosinophil %: 6.8 %
HCT: 36.8 % (ref 35.0–47.0)
HGB: 12.1 g/dL (ref 12.0–16.0)
Lymphocyte #: 1.1 x10 3/mm (ref 1.0–3.6)
Lymphocyte %: 16.5 %
MCHC: 32.7 g/dL (ref 32.0–36.0)
MCV: 88 fL (ref 80–100)
Monocyte %: 9.5 %
Neutrophil #: 4.6 x10 3/mm (ref 1.4–6.5)
RDW: 16 % — ABNORMAL HIGH (ref 11.5–14.5)
WBC: 6.8 x10 3/mm (ref 3.6–11.0)

## 2011-12-29 ENCOUNTER — Other Ambulatory Visit: Payer: Self-pay | Admitting: Internal Medicine

## 2011-12-29 MED ORDER — ACETAMINOPHEN-CODEINE 300-60 MG PO TABS: 1.0000 | ORAL_TABLET | Freq: Four times a day (QID) | ORAL | Status: DC | PRN

## 2011-12-31 LAB — CBC CANCER CENTER
Basophil #: 0 x10 3/mm (ref 0.0–0.1)
Basophil %: 0.3 %
Eosinophil #: 0.5 x10 3/mm (ref 0.0–0.7)
Eosinophil %: 6.7 %
HCT: 36.6 % (ref 35.0–47.0)
HGB: 12 g/dL (ref 12.0–16.0)
Lymphocyte %: 17.2 %
MCH: 29.2 pg (ref 26.0–34.0)
MCHC: 32.8 g/dL (ref 32.0–36.0)
MCV: 89 fL (ref 80–100)
Neutrophil %: 66.1 %
Platelet: 266 x10 3/mm (ref 150–440)
RDW: 16.1 % — ABNORMAL HIGH (ref 11.5–14.5)

## 2012-01-02 ENCOUNTER — Ambulatory Visit: Payer: Self-pay | Admitting: Radiation Oncology

## 2012-01-07 LAB — CBC CANCER CENTER
Basophil #: 0 x10 3/mm (ref 0.0–0.1)
Basophil %: 0.9 %
Eosinophil #: 0.5 x10 3/mm (ref 0.0–0.7)
HCT: 35 % (ref 35.0–47.0)
HGB: 11.4 g/dL — ABNORMAL LOW (ref 12.0–16.0)
MCHC: 32.6 g/dL (ref 32.0–36.0)
MCV: 90 fL (ref 80–100)
Neutrophil #: 3.4 x10 3/mm (ref 1.4–6.5)
Platelet: 224 x10 3/mm (ref 150–440)
RDW: 15.8 % — ABNORMAL HIGH (ref 11.5–14.5)

## 2012-01-14 LAB — CBC CANCER CENTER
Basophil #: 0 x10 3/mm (ref 0.0–0.1)
Eosinophil #: 0.4 x10 3/mm (ref 0.0–0.7)
HGB: 12.2 g/dL (ref 12.0–16.0)
Lymphocyte #: 0.8 x10 3/mm — ABNORMAL LOW (ref 1.0–3.6)
Lymphocyte %: 18.8 %
MCV: 88 fL (ref 80–100)
Neutrophil #: 2.5 x10 3/mm (ref 1.4–6.5)
Neutrophil %: 58.2 %
Platelet: 263 x10 3/mm (ref 150–440)
RBC: 4.18 10*6/uL (ref 3.80–5.20)
RDW: 15.2 % — ABNORMAL HIGH (ref 11.5–14.5)
WBC: 4.3 x10 3/mm (ref 3.6–11.0)

## 2012-01-21 LAB — CBC CANCER CENTER
Basophil #: 0 x10 3/mm (ref 0.0–0.1)
Basophil %: 0.2 %
Eosinophil %: 10 %
HCT: 35.8 % (ref 35.0–47.0)
Lymphocyte #: 0.8 x10 3/mm — ABNORMAL LOW (ref 1.0–3.6)
Lymphocyte %: 13.6 %
MCH: 29.5 pg (ref 26.0–34.0)
Monocyte #: 0.5 x10 3/mm (ref 0.2–0.9)
Monocyte %: 9 %
Neutrophil #: 3.9 x10 3/mm (ref 1.4–6.5)
Neutrophil %: 67.2 %
Platelet: 299 x10 3/mm (ref 150–440)
RBC: 4.05 10*6/uL (ref 3.80–5.20)
WBC: 5.7 x10 3/mm (ref 3.6–11.0)

## 2012-02-01 ENCOUNTER — Ambulatory Visit: Payer: Self-pay | Admitting: Radiation Oncology

## 2012-03-03 ENCOUNTER — Ambulatory Visit: Payer: Self-pay | Admitting: Radiation Oncology

## 2012-03-10 ENCOUNTER — Other Ambulatory Visit: Payer: Self-pay | Admitting: Internal Medicine

## 2012-03-14 ENCOUNTER — Other Ambulatory Visit: Payer: Self-pay | Admitting: *Deleted

## 2012-03-14 MED ORDER — ALPRAZOLAM 0.5 MG PO TABS: 0.5000 mg | ORAL_TABLET | Freq: Every day | ORAL | Status: DC | PRN

## 2012-03-14 MED ORDER — ACETAMINOPHEN-CODEINE 300-60 MG PO TABS: 1.0000 | ORAL_TABLET | Freq: Four times a day (QID) | ORAL | Status: DC | PRN

## 2012-03-17 ENCOUNTER — Other Ambulatory Visit: Payer: Self-pay | Admitting: Internal Medicine

## 2012-03-18 ENCOUNTER — Telehealth: Payer: Self-pay | Admitting: Internal Medicine

## 2012-03-18 NOTE — Telephone Encounter (Signed)
Patient has been advised that this has been handled.

## 2012-03-18 NOTE — Telephone Encounter (Signed)
Pt called stated that [facility] [address] has been trying to get refills on Her xanax and tylenol 4 Please advise when this is called in Pt is completely out of meds

## 2012-03-18 NOTE — Telephone Encounter (Signed)
This was done on the 12th.

## 2012-03-24 ENCOUNTER — Ambulatory Visit: Payer: Self-pay

## 2012-03-24 LAB — HEMATOCRIT: HCT: 34.2 % — ABNORMAL LOW (ref 35.0–47.0)

## 2012-04-05 ENCOUNTER — Ambulatory Visit: Payer: Self-pay

## 2012-04-05 LAB — PREGNANCY, URINE: Pregnancy Test, Urine: NEGATIVE m[IU]/mL

## 2012-04-06 LAB — HEMOGLOBIN: HGB: 10.5 g/dL — ABNORMAL LOW (ref 12.0–16.0)

## 2012-04-08 LAB — PATHOLOGY REPORT

## 2012-04-09 LAB — HM PAP SMEAR: HM Pap smear: NORMAL

## 2012-04-11 ENCOUNTER — Ambulatory Visit (INDEPENDENT_AMBULATORY_CARE_PROVIDER_SITE_OTHER): Payer: Managed Care, Other (non HMO) | Admitting: Internal Medicine

## 2012-04-11 ENCOUNTER — Encounter: Payer: Self-pay | Admitting: Internal Medicine

## 2012-04-11 VITALS — BP 120/64 | HR 120 | Temp 98.9°F | Resp 16 | Wt 159.5 lb

## 2012-04-11 DIAGNOSIS — D0510 Intraductal carcinoma in situ of unspecified breast: Secondary | ICD-10-CM

## 2012-04-11 DIAGNOSIS — D0590 Unspecified type of carcinoma in situ of unspecified breast: Secondary | ICD-10-CM

## 2012-04-11 DIAGNOSIS — R531 Weakness: Secondary | ICD-10-CM

## 2012-04-11 DIAGNOSIS — R5381 Other malaise: Secondary | ICD-10-CM

## 2012-04-11 DIAGNOSIS — F4310 Post-traumatic stress disorder, unspecified: Secondary | ICD-10-CM

## 2012-04-11 DIAGNOSIS — IMO0001 Reserved for inherently not codable concepts without codable children: Secondary | ICD-10-CM

## 2012-04-11 NOTE — Assessment & Plan Note (Addendum)
Managed with wide excision  and radiation to right breast.  ER/PR negative.  Next mammogram due March 2014.

## 2012-04-11 NOTE — Progress Notes (Signed)
Patient ID: [name], female   DOB: 07-15-1971, 41 y.o.   MRN: [id number]  Patient Active Problem List  Diagnosis  . ASTHMA, EXERCISE INDUCED  . GERD  . IRRITABLE BOWEL SYNDROME  . FIBROMYALGIA  . Post traumatic stress disorder  . Panic disorder  . Ductal carcinoma in situ of breast    Subjective:  CC:   Chief Complaint  Patient presents with  . Follow-up    HPI:   [name] a 41 y.o. female who presents  For follow up on fibromyalgia,  Breast CA , depression with generalized anxiety.  She has had a prolonged period of disability for severe depression and PTSD during which time she was diagnosed with breast cancer and an ovarian mass.  She has completed treatment ofor ER/PRnegative DCIS with excision and XRT, .  Last week she  underwent a TAH/BSO last Tuesday.  She had a large tumor on right ovary which was benign.  Dr. Logan Bores noted a significant amount of scarring  From adhesions.  She feels better than she has in a long time and last 15 lbs in one week.   She has been seeing a psychiatrist and therapist and symptoms and is close to feeling resolution of the grief she has been experiencing since her father's death 3 years ago.  Her insmonia and anxiety/depression are now managed with zoloft 150 mg and klonopin 1.5 ng at night for insomnia.  She continues to see the grief counselor .  After she was diagnosed with breast cancer she was referred to [facility] and has started taking a low impact aerobics class, receiving massage and healing touch therapy.  She feels generally better but continues to feel physically very weak and notes excessive fatigur after 1 to 2 hours of activity .   She is requesting [facility] for  PT for strength training and endurance.  Past Medical History  Diagnosis Date  . Basal cell carcinoma 2007    Left Breast  . Herniated disc     Degenerative, Lumbar disc  . Asthma     exercise induced  . Fibromyalgia   . Fracture 1993    Left Foot    . GERD (gastroesophageal reflux disease)     hiatal hernia  . Whiplash 2005    secondary to rear end collison  . Panic attack 2008    hospitalized  . IBS (irritable bowel syndrome) 2008    stress induced constipation/diarrhea  . Major depressive disorder 2001, 1996    was out of work for 3 and 6 months  . Cystitis     recurrent, precipitated by sexual intercourse  . Migraine     tension    Past Surgical History  Procedure Date  . Cesarean section 2007    emergency  . Laparoscopic endometriosis fulguration 202    Dr. Luella Cook  . Wisdom tooth extraction 1991    x 4         The following portions of the patient's history were reviewed and updated as appropriate: Allergies, current medications, and problem list.    Review of Systems:   12 Pt  review of systems was negative except those addressed in the HPI,     History   Social History  . Marital Status: Married    Spouse Name: N/A    Number of Children: N/A  . Years of Education: N/A   Occupational History  . Psychologist, sport and exercise for [employer]  Social History Main Topics  . Smoking status: Former Smoker    Quit date: 03/29/1998  . Smokeless tobacco: Never Used  . Alcohol Use: Yes     rare  . Drug Use: No  . Sexually Active: Not on file   Other Topics Concern  . Not on file   Social History Narrative  . No narrative on file    Objective:  BP 120/64  Pulse 120  Temp 98.9 F (37.2 C) (Oral)  Resp 16  Wt 159 lb 8 oz (72.349 kg)  SpO2 95%  LMP 09/14/2011  General appearance: alert, cooperative and appears stated age Ears: normal TM's and external ear canals both ears Throat: lips, mucosa, and tongue normal; teeth and gums normal Neck: no adenopathy, no carotid bruit, supple, symmetrical, trachea midline and thyroid not enlarged, symmetric, no tenderness/mass/nodules Back: symmetric, no curvature. ROM normal. No CVA tenderness. Lungs: clear to auscultation bilaterally Heart: regular  rate and rhythm, S1, S2 normal, no murmur, click, rub or gallop Abdomen: soft, non-tender; bowel sounds normal; no masses,  no organomegaly Pulses: 2+ and symmetric Skin: Skin color, texture, turgor normal. No rashes or lesions Lymph nodes: Cervical, supraclavicular, and axillary nodes normal.  Assessment and Plan:  FIBROMYALGIA currenlty in remission since her surgery .  continue massage and healing touch therapy.  Adding referral to PT and recommending continued leave of absence until January to improve her endurance and physical health.  Post traumatic stress disorder Now nearly resolved.  Continue klonopin and zoloft.   Ductal carcinoma in situ of breast Managed with wide excision  and radiation to right breast.  ER/PR negative.  Next mammogram due March 2014.   Updated Medication List Outpatient Encounter Prescriptions as of 04/11/2012  Medication Sig Dispense Refill  . acetaminophen-codeine (TYLENOL #4) 300-60 MG per tablet Take 1 tablet by mouth every 6 (six) hours as needed.  60 tablet  3  . albuterol (PROVENTIL,VENTOLIN) 90 MCG/ACT inhaler Inhale 2 puffs into the lungs 4 (four) times daily as needed.       . ALPRAZolam (XANAX) 0.5 MG tablet Take 1 tablet (0.5 mg total) by mouth daily as needed.  30 tablet  2  . Beclomethasone Dipropionate (QNASL) 80 MCG/ACT AERS Place into the nose.      . calcium carbonate (TUMS - DOSED IN MG ELEMENTAL CALCIUM) 500 MG chewable tablet Chew 1 tablet by mouth at bedtime.        . cetirizine (ZYRTEC) 10 MG tablet Take 10 mg by mouth daily.        . clonazePAM (KLONOPIN) 1 MG tablet Take 1.5 mg by mouth at bedtime.      . cyclobenzaprine (FLEXERIL) 10 MG tablet TAKE 1 TABLET BY MOUTH TWICE A DAY AS NEEDED FOR MUSCLE SPASM  60 tablet  5  . nitrofurantoin, macrocrystal-monohydrate, (MACROBID) 100 MG capsule Take 100 mg by mouth 2 (two) times daily as needed. For dysuria       . omeprazole (PRILOSEC) 40 MG capsule TAKE 1 CAPSULE BY MOUTH EVERY DAY  30  capsule  10  . phenazopyridine (PYRIDIUM) 200 MG tablet Take 200 mg by mouth 2 (two) times daily as needed. For dysuria       . Probiotic Product (PROBIOTIC PO) Take by mouth at bedtime.        . sertraline (ZOLOFT) 100 MG tablet Take 150 mg by mouth daily.       Marland Kitchen DISCONTD: norethindrone (MICRONOR,CAMILA,ERRIN) 0.35 MG tablet Take 1 tablet by mouth daily.  Birth control          Orders Placed This Encounter  Procedures  . Ambulatory referral to Physical Therapy    No Follow-up on file.

## 2012-04-11 NOTE — Assessment & Plan Note (Addendum)
currenlty in remission since her surgery .  continue massage and healing touch therapy.  Adding referral to PT and recommending continued leave of absence until January to improve her endurance and physical health.

## 2012-04-11 NOTE — Assessment & Plan Note (Signed)
Now nearly resolved.  Continue klonopin and zoloft.

## 2012-04-20 ENCOUNTER — Encounter: Payer: Self-pay | Admitting: Internal Medicine

## 2012-05-03 ENCOUNTER — Encounter: Payer: Self-pay | Admitting: Internal Medicine

## 2012-05-03 ENCOUNTER — Ambulatory Visit: Payer: Self-pay | Admitting: Radiation Oncology

## 2012-05-04 ENCOUNTER — Telehealth: Payer: Self-pay | Admitting: Internal Medicine

## 2012-05-04 NOTE — Telephone Encounter (Signed)
Please tell patient that if her insurnance is not willing to extend disability until jan 2 based on what I wrote, I can do no more without crossing a line that I am not comfortable crossing.  She looked really good at her last visit so I think she out to consider going back at the end of October.

## 2012-05-04 NOTE — Telephone Encounter (Signed)
Pt came in today  She needs a more specific reason for being out till 08/04/12  Dr Darrick Huntsman [employer] claim # [id number] Fax # [phone] Specific description regarding my need for extending my disability till jan 2 - continuing grief counseling -physical therapy to strengthen muscles from fibro & cancer rehab

## 2012-05-05 NOTE — Telephone Encounter (Signed)
Patient notified as instructed. 

## 2012-05-13 ENCOUNTER — Ambulatory Visit (INDEPENDENT_AMBULATORY_CARE_PROVIDER_SITE_OTHER): Payer: Managed Care, Other (non HMO) | Admitting: Internal Medicine

## 2012-05-13 ENCOUNTER — Encounter: Payer: Self-pay | Admitting: Internal Medicine

## 2012-05-13 VITALS — BP 112/62 | HR 95 | Temp 98.2°F | Ht 64.0 in | Wt 163.0 lb

## 2012-05-13 DIAGNOSIS — IMO0001 Reserved for inherently not codable concepts without codable children: Secondary | ICD-10-CM

## 2012-05-13 DIAGNOSIS — F4310 Post-traumatic stress disorder, unspecified: Secondary | ICD-10-CM

## 2012-05-14 ENCOUNTER — Encounter: Payer: Self-pay | Admitting: Internal Medicine

## 2012-05-14 NOTE — Progress Notes (Signed)
Patient ID: [name], female   DOB: 05-31-1971, 41 y.o.   MRN: [id number]  Patient Active Problem List  Diagnosis  . ASTHMA, EXERCISE INDUCED  . GERD  . IRRITABLE BOWEL SYNDROME  . FIBROMYALGIA  . Post traumatic stress disorder  . Panic disorder  . Ductal carcinoma in situ of breast    Subjective:  CC:   Chief Complaint  Patient presents with  . Advice Only    HPI:   [name] a 41 y.o. female who presents 1 month after follow up on persistent fatigue and weakness, and depression/anxiety.  Patient was seen last month for the first time since February with a request to have her disability extended until January 2014.    Her demeanor today is very different than last month, when she appeared confidant, optimistic and energetic.  The letter I had written was unaccompanied by supportive records from her physical therapist and grief counselors with whom she had been working for months and her request for extension of disability was denied without additional information. 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  She is very upset about my decision . She is fearful that returning to work at the end of October will undo all of the progress she has made.  She has had a prolonged period of disability .  She has been taking a low impact aerobics class, receiving massage and healing touch therapy.    Past Medical History  Diagnosis Date  . Basal cell carcinoma 2007    Left Breast  . Herniated disc     Degenerative, Lumbar disc  . Asthma     exercise induced  . Fibromyalgia   . Fracture 1993    Left Foot  . GERD (gastroesophageal reflux disease)     hiatal hernia  . Whiplash 2005    secondary to rear end collison  . Panic attack 2008   hospitalized  . IBS (irritable bowel syndrome) 2008    stress induced constipation/diarrhea  . Major depressive disorder 2001, 1996    was out of work for 3 and 6 months  . Cystitis     recurrent, precipitated by sexual intercourse  . Migraine     tension    Past Surgical History  Procedure Date  . Cesarean section 2007    emergency  . Laparoscopic endometriosis fulguration 202    Dr. Luella Cook  . Wisdom tooth extraction 1991    x 4         The following portions of the patient's history were reviewed and updated as appropriate: Allergies, current medications, and problem list.    Review of Systems:   12 Pt  review of systems was negative except those addressed in the HPI,     History   Social History  . Marital Status: Married    Spouse Name: N/A    Number of Children: N/A  . Years of Education: N/A   Occupational History  . Psychologist, sport and exercise for insurance   Social History Main Topics  . Smoking status: Former Smoker    Quit date: 03/29/1998  . Smokeless tobacco: Never Used  . Alcohol Use: Yes     rare  . Drug  Use: No  . Sexually Active: Not on file   Other Topics Concern  . Not on file   Social History Narrative  . No narrative on file    Objective:  BP 112/62  Pulse 95  Temp 98.2 F (36.8 C) (Oral)  Ht 5\' 4"  (1.626 m)  Wt 163 lb (73.936 kg)  BMI 27.98 kg/m2  SpO2 96%  LMP 09/14/2011  General appearance: alert, cooperative and appears stated age Psych:  Calm, appropriate, makes eye contact.    Assessment and Plan:  Post traumatic stress disorder She was released by her psychiatrist, Dr. Jennelle Human, reportedly because she refused to take the medications prescribed.  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  I  have countered that if her physical therapist is willing to objectively assess her current physical state and render an opinion on her readiness to return to work .  I will reconsider my statement. She does not think she will be able to provide that and has requested a letter to return to work.  I have written a letter stating that she can return to work part time at the end of October, to allow her to continue her schedule of grief counselling., healing touch and physical therapy/personal training.    Updated Medication List Outpatient Encounter Prescriptions as of 05/13/2012  Medication Sig Dispense Refill  . acetaminophen-codeine (TYLENOL #4) 300-60 MG per tablet Take 1 tablet by mouth every 6 (six) hours as needed.  60 tablet  3  . albuterol (PROVENTIL,VENTOLIN) 90 MCG/ACT inhaler Inhale 2 puffs into the lungs 4 (four) times daily as needed.       . ALPRAZolam (XANAX) 0.5 MG tablet Take 1 tablet (0.5 mg total) by mouth daily as needed.  30 tablet  2  . Beclomethasone Dipropionate (QNASL) 80 MCG/ACT AERS Place into the nose.      . calcium carbonate (TUMS - DOSED IN MG ELEMENTAL CALCIUM) 500 MG chewable tablet Chew 1 tablet by mouth at bedtime.        . cetirizine (ZYRTEC) 10 MG tablet Take 10 mg by mouth daily.        . clonazePAM (KLONOPIN) 1 MG tablet Take 1.5 mg by mouth at bedtime.      . cyclobenzaprine (FLEXERIL) 10 MG tablet TAKE 1 TABLET BY MOUTH TWICE A DAY AS NEEDED FOR MUSCLE SPASM  60 tablet  5  . nitrofurantoin, macrocrystal-monohydrate, (MACROBID) 100 MG capsule Take 100 mg by mouth 2 (two) times daily as needed. For dysuria       . omeprazole (PRILOSEC) 40 MG capsule TAKE 1 CAPSULE BY MOUTH EVERY DAY  30 capsule  10  . phenazopyridine (PYRIDIUM) 200 MG tablet Take 200 mg by mouth 2 (two) times daily as needed. For dysuria       . Probiotic Product (PROBIOTIC PO) Take by mouth at bedtime.        . sertraline (ZOLOFT) 100 MG tablet Take 150 mg by mouth daily.          No orders  of the defined types were placed in this encounter.    No Follow-up on file.

## 2012-05-14 NOTE — Assessment & Plan Note (Addendum)
She had become severely deconditioned for the past 7 years and was referred last month to  [facility] PT to improve endurance and stability.  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  I have countered that if her physical therapist is willing to objectively assess her current physical state and render an opinion on her readiness to return to work,  I will reconsider revising my statement. She does not think she will be able to provide that and has requested a letter to return to work.  I have written a letter stating that she can return to work part time at the end of October, to allow her to continue her schedule of grief counselling., healing touch and physical therapy.  20 minutes was spent in discussion with patient today.

## 2012-05-14 NOTE — Assessment & Plan Note (Signed)
She was released by her psychiatrist, Dr. Jennelle Human, reportedly because she refused to take the medications prescribed.  I have no records from her previous psychiatric evaluation and treatment

## 2012-05-26 ENCOUNTER — Telehealth: Payer: Self-pay | Admitting: Internal Medicine

## 2012-05-26 NOTE — Telephone Encounter (Signed)
Pt is needing her disability forms faxed over to [employer] [phone] and [place] Attn: to Josetta Huddle [phone] and it was from the letter on 05/13/12. Please call pt back when the letter is faxed if possible

## 2012-05-31 NOTE — Telephone Encounter (Signed)
Spoke to patient letter faxed to [employer] [phone] and Josetta Huddle [phone]

## 2012-05-31 NOTE — Telephone Encounter (Signed)
Do you have a disability form for this patient because I don't have it in my stack of papers.

## 2012-05-31 NOTE — Telephone Encounter (Signed)
There are no disability forms other thatn the ones I faxed over last month to [employer]. I gave a letter  At her last visit that stated she can return to work half time at the end of October.  I have reprinted that.

## 2012-06-03 ENCOUNTER — Ambulatory Visit: Payer: Self-pay | Admitting: Radiation Oncology

## 2012-06-03 ENCOUNTER — Encounter: Payer: Self-pay | Admitting: Internal Medicine

## 2012-06-10 ENCOUNTER — Telehealth: Payer: Self-pay | Admitting: Internal Medicine

## 2012-06-10 NOTE — Telephone Encounter (Signed)
Please ask patient to respond or resend her message using MyChart,  Not my personal e mail.

## 2012-06-10 NOTE — Telephone Encounter (Signed)
Caller: Chele/Patient; Patient Name: [name]; PCP: Duncan Dull (Adults only); Best Callback Phone Number: [phone] Patient states she sent an email to Dr. Newton Pigg yesterday 06/09/12 to email address- [email or web address].  She is wanting to know if Dr. Darrick Huntsman received the email. She is unsure if it is the right email,  It does not appear to be read.  It is regarding her disablilty information. She lost her job on the day of return to work date.  THIS IS URGENT . The email explaines her work related problems/health insurance. REVIEWED EPIC. Unable to note a computer email received.  Advised caller that I would forward message to office for review.  PLEASE REVIEW PATIENT EMAIL OR CONTACT PATIENT REGARDING PAPER WORK. [phone]

## 2012-06-10 NOTE — Telephone Encounter (Signed)
Left message on patient vm with instructions below.

## 2012-06-14 ENCOUNTER — Telehealth: Payer: Self-pay | Admitting: Internal Medicine

## 2012-06-14 ENCOUNTER — Encounter: Payer: Self-pay | Admitting: Internal Medicine

## 2012-06-14 DIAGNOSIS — M797 Fibromyalgia: Secondary | ICD-10-CM

## 2012-06-14 NOTE — Telephone Encounter (Signed)
I called the patient to let her know that I had generated a new activation code. She did ask for it over the phone however [facility] policy is not to give over the phone.  I gave her the option of me sending through mail or she could come by and pick up. She will be here this morning to pick up it is in the according file for her to pick up.

## 2012-06-14 NOTE — Telephone Encounter (Signed)
Pt is needing to get a message to Dr. Darrick Huntsman and Dr. Darrick Huntsman told her that was the only way to get it to her??? She was wondering if she could get the activation code.

## 2012-06-20 ENCOUNTER — Other Ambulatory Visit: Payer: Self-pay | Admitting: Internal Medicine

## 2012-06-21 ENCOUNTER — Other Ambulatory Visit: Payer: Self-pay

## 2012-06-21 MED ORDER — CLONAZEPAM 1 MG PO TABS: 1.5000 mg | ORAL_TABLET | Freq: Every day | ORAL | Status: DC

## 2012-06-21 NOTE — Telephone Encounter (Signed)
Klonopin faxed to [facility]

## 2012-06-21 NOTE — Telephone Encounter (Signed)
Refill request for Clonazepam 1 mg. Ok to refill?

## 2012-06-21 NOTE — Telephone Encounter (Signed)
May refill for one month,  Then she needs to have her psychiatrist start prescribing

## 2012-07-03 ENCOUNTER — Ambulatory Visit: Payer: Self-pay | Admitting: Radiation Oncology

## 2012-07-14 ENCOUNTER — Other Ambulatory Visit: Payer: Self-pay

## 2012-07-14 NOTE — Telephone Encounter (Signed)
Refill request for Xanax 0.5 mg. Ok to refill? 

## 2012-07-15 ENCOUNTER — Other Ambulatory Visit: Payer: Self-pay

## 2012-07-15 MED ORDER — ALPRAZOLAM 0.5 MG PO TABS: 0.5000 mg | ORAL_TABLET | Freq: Every day | ORAL | Status: DC | PRN

## 2012-07-15 NOTE — Telephone Encounter (Signed)
Xanax 0.5 mg #30 0 R called in to [facility]

## 2012-07-17 ENCOUNTER — Encounter: Payer: Self-pay | Admitting: Internal Medicine

## 2012-07-19 ENCOUNTER — Other Ambulatory Visit: Payer: Self-pay | Admitting: Internal Medicine

## 2012-07-19 DIAGNOSIS — E785 Hyperlipidemia, unspecified: Secondary | ICD-10-CM

## 2012-07-19 DIAGNOSIS — Z79899 Other long term (current) drug therapy: Secondary | ICD-10-CM

## 2012-07-19 NOTE — Telephone Encounter (Signed)
Thank her for her e mail.  i have not had a chance to update her chart yet with all of the info she sent me.  I did notice that her cholesterol was quite elevated on recent labs done by Dr. Welton Flakes,  Ded she address the LDL of  256 and the triglycerides of 349?

## 2012-07-19 NOTE — Telephone Encounter (Signed)
Spoke to patient she stated that she has not addressed the issue yet and what is her next step for her to do.

## 2012-07-20 MED ORDER — FENOFIBRATE 145 MG PO TABS: 145.0000 mg | ORAL_TABLET | Freq: Every day | ORAL | Status: DC

## 2012-07-20 MED ORDER — CLONAZEPAM 1 MG PO TABS: 1.5000 mg | ORAL_TABLET | Freq: Every day | ORAL | Status: DC

## 2012-07-20 NOTE — Telephone Encounter (Signed)
Fenofibrate rx,  I will send to pharmacy. Repeat fasting lipids and CMET in 6 to 8 weeks

## 2012-07-21 ENCOUNTER — Other Ambulatory Visit: Payer: Self-pay

## 2012-07-21 MED ORDER — CLONAZEPAM 1 MG PO TABS: 1.5000 mg | ORAL_TABLET | Freq: Every day | ORAL | Status: DC

## 2012-07-21 NOTE — Telephone Encounter (Signed)
[facility]opin 1 [facility]

## 2012-07-21 NOTE — Telephone Encounter (Signed)
Refill on clonazepam auth and on printer.

## 2012-07-25 ENCOUNTER — Telehealth: Payer: Self-pay | Admitting: *Deleted

## 2012-07-25 NOTE — Telephone Encounter (Signed)
Patient called to tell us that she normally takes Lipitor, but that you gave her Tricor instead. She wants to know if you meant for her to take Tricor instead of Lipitor. Please advise

## 2012-07-25 NOTE — Telephone Encounter (Signed)
Yes I did

## 2012-07-25 NOTE — Telephone Encounter (Signed)
Correction  I see that Dr Welton Flakes started her on lipitor, which we can try first.  I was not aware that she was taking that.She does not need to take both. So if she has already picked up the fenofibrate, just have her put it aside .  We will give the lipitor  A trial of 3 months first,  i have re read her e mail from dec 15th which was quite long and I see that she has requested that I take over her bipolar medications. I am not willing to that that.  She needs to stay with her psychiatrist and let her continue prescribing those. Her psychiatric issues need to be managed by the psychiatrist,  Not me.

## 2012-07-26 NOTE — Telephone Encounter (Signed)
Pt.notified

## 2012-07-30 ENCOUNTER — Other Ambulatory Visit: Payer: Self-pay | Admitting: Internal Medicine

## 2012-08-02 ENCOUNTER — Ambulatory Visit: Payer: Self-pay | Admitting: Pain Medicine

## 2012-08-02 ENCOUNTER — Other Ambulatory Visit: Payer: Self-pay | Admitting: Internal Medicine

## 2012-08-02 NOTE — Telephone Encounter (Signed)
Med filled.  

## 2012-08-09 ENCOUNTER — Telehealth: Payer: Self-pay | Admitting: Internal Medicine

## 2012-08-09 NOTE — Telephone Encounter (Signed)
acetaminophen-codeine (TYLENOL #4) 300-60 MG per tablet

## 2012-08-10 ENCOUNTER — Other Ambulatory Visit: Payer: Self-pay | Admitting: Internal Medicine

## 2012-08-10 NOTE — Telephone Encounter (Signed)
Tylenol #4 refill request. Pt last seen on 10/11 and med last filled on 8/12 #60 with 3 refills. Ok to fill?

## 2012-08-10 NOTE — Telephone Encounter (Signed)
acetaminophen-codeine (TYLENOL #4) 300-60 MG per tablet

## 2012-08-10 NOTE — Telephone Encounter (Signed)
Pt.notified

## 2012-08-10 NOTE — Telephone Encounter (Signed)
Pt notified that she would have to contact pain management clinic for refills.

## 2012-08-10 NOTE — Telephone Encounter (Signed)
No,  The Pain clinic doctor will be taking over narcotics prescriptions per his recent correspondence

## 2012-08-21 ENCOUNTER — Encounter: Payer: Self-pay | Admitting: Internal Medicine

## 2012-08-23 ENCOUNTER — Other Ambulatory Visit: Payer: Self-pay | Admitting: Internal Medicine

## 2012-08-23 ENCOUNTER — Encounter: Payer: Self-pay | Admitting: Internal Medicine

## 2012-08-23 MED ORDER — ACETAMINOPHEN-CODEINE 300-60 MG PO TABS: 1.0000 | ORAL_TABLET | Freq: Four times a day (QID) | ORAL | Status: DC | PRN

## 2012-08-24 ENCOUNTER — Other Ambulatory Visit: Payer: Self-pay | Admitting: Internal Medicine

## 2012-08-24 MED ORDER — ATORVASTATIN CALCIUM 20 MG PO TABS: 20.0000 mg | ORAL_TABLET | Freq: Every day | ORAL | Status: DC

## 2012-08-24 MED ORDER — ACETAMINOPHEN-CODEINE 300-60 MG PO TABS: 1.0000 | ORAL_TABLET | Freq: Four times a day (QID) | ORAL | Status: DC | PRN

## 2012-09-05 ENCOUNTER — Telehealth: Payer: Self-pay | Admitting: Internal Medicine

## 2012-09-07 ENCOUNTER — Ambulatory Visit: Payer: Self-pay | Admitting: Pain Medicine

## 2012-09-07 NOTE — Telephone Encounter (Signed)
error 

## 2012-09-09 ENCOUNTER — Encounter: Payer: Self-pay | Admitting: *Deleted

## 2012-09-12 ENCOUNTER — Ambulatory Visit: Payer: Self-pay | Admitting: Pain Medicine

## 2012-09-17 ENCOUNTER — Other Ambulatory Visit: Payer: Self-pay

## 2012-09-18 ENCOUNTER — Other Ambulatory Visit: Payer: Self-pay | Admitting: Internal Medicine

## 2012-09-19 NOTE — Telephone Encounter (Signed)
Received refill request electronically. Is it okay to refill medication? 

## 2012-09-21 ENCOUNTER — Telehealth: Payer: Self-pay | Admitting: Internal Medicine

## 2012-09-21 NOTE — Telephone Encounter (Signed)
[facility] [address] [facility] regarding refilling her klonopin.  [facility] state [facility] states [facility] have had no response and are sending it again this morning.  Pt states she needs this medication because she has major withdrawals and only has one more day's worth.  Pt asks to please contact her concerning this.

## 2012-09-21 NOTE — Telephone Encounter (Signed)
Med was called in today with 4 refills.

## 2012-10-05 ENCOUNTER — Encounter: Payer: Self-pay | Admitting: Internal Medicine

## 2012-10-05 MED ORDER — OMEPRAZOLE 40 MG PO CPDR: 40.0000 mg | DELAYED_RELEASE_CAPSULE | Freq: Two times a day (BID) | ORAL | Status: DC

## 2012-10-06 ENCOUNTER — Ambulatory Visit: Payer: Self-pay | Admitting: General Surgery

## 2012-11-04 ENCOUNTER — Telehealth: Payer: Self-pay | Admitting: General Practice

## 2012-11-04 NOTE — Telephone Encounter (Signed)
No.  I already told her last month  Via [employer]Char e mail that  I do not refill narcotics on patients who are seeing a Pain specialist because the other doctor has taken that over.

## 2012-11-04 NOTE — Telephone Encounter (Signed)
Pt called stating she needs a refill of her acetaminophen-codeine (TYLENOL #4). Please advise.

## 2012-11-04 NOTE — Telephone Encounter (Signed)
Noted. Pt notified.  

## 2012-11-08 ENCOUNTER — Encounter: Payer: Self-pay | Admitting: Internal Medicine

## 2012-11-08 ENCOUNTER — Telehealth: Payer: Self-pay | Admitting: General Practice

## 2012-11-08 MED ORDER — ACETAMINOPHEN-CODEINE 300-60 MG PO TABS: 1.0000 | ORAL_TABLET | Freq: Four times a day (QID) | ORAL | Status: DC | PRN

## 2012-11-08 NOTE — Telephone Encounter (Signed)
Called Dr. Letta Moynahan office with pain management to confirm. However their office closes at 4pm. Callback number is: [facility]  [phone] (Office)

## 2012-11-08 NOTE — Telephone Encounter (Signed)
Called the following to confirm with Dr. Metta Clines. Office closed at 4pm will try again in the morning.   [facility]  [phone] (Office)

## 2012-11-09 ENCOUNTER — Encounter: Payer: Self-pay | Admitting: Internal Medicine

## 2012-11-09 NOTE — Telephone Encounter (Signed)
We received a fax regarding this matter yesterday.  Placed in Tullo's folder to sign last night.

## 2012-11-11 ENCOUNTER — Telehealth: Payer: Self-pay | Admitting: General Practice

## 2012-11-11 ENCOUNTER — Encounter: Payer: Self-pay | Admitting: General Practice

## 2012-11-11 NOTE — Telephone Encounter (Signed)
Pt states that she sees Dr. Ewing Schlein for Fibromyalgia pain Management. She has had 6 injections of numbing meds/muscle relaxers/steroids in her head and neck and have no relief. She is currently using the [employer]Med machine for pain relief lasts no more than 24 hours. Tylenol 4 is her only relief.

## 2012-11-11 NOTE — Telephone Encounter (Signed)
Office closed at noon on Friday.

## 2012-11-27 ENCOUNTER — Other Ambulatory Visit: Payer: Self-pay | Admitting: Internal Medicine

## 2012-11-28 ENCOUNTER — Ambulatory Visit: Payer: Self-pay | Admitting: Pain Medicine

## 2012-11-28 ENCOUNTER — Other Ambulatory Visit: Payer: Self-pay | Admitting: *Deleted

## 2012-11-29 ENCOUNTER — Other Ambulatory Visit: Payer: Self-pay | Admitting: *Deleted

## 2012-11-29 MED ORDER — ALPRAZOLAM 0.5 MG PO TABS: 0.5000 mg | ORAL_TABLET | Freq: Every day | ORAL | Status: DC | PRN

## 2012-11-29 NOTE — Telephone Encounter (Signed)
Ok to refill alprazolam ,  Authorized in epic,  Can call in

## 2012-11-30 NOTE — Telephone Encounter (Signed)
Script called to pharmacy

## 2012-12-21 ENCOUNTER — Other Ambulatory Visit: Payer: Self-pay | Admitting: Internal Medicine

## 2012-12-21 ENCOUNTER — Other Ambulatory Visit: Payer: Self-pay | Admitting: *Deleted

## 2012-12-21 ENCOUNTER — Encounter: Payer: Self-pay | Admitting: Internal Medicine

## 2012-12-21 DIAGNOSIS — M797 Fibromyalgia: Secondary | ICD-10-CM

## 2012-12-21 MED ORDER — ALBUTEROL SULFATE HFA 108 (90 BASE) MCG/ACT IN AERS: INHALATION_SPRAY | RESPIRATORY_TRACT | Status: DC

## 2012-12-21 NOTE — Telephone Encounter (Signed)
Pt needs to be seen for further refills

## 2012-12-23 NOTE — Telephone Encounter (Signed)
Amber please handle and call patient back.  thanks

## 2012-12-26 ENCOUNTER — Encounter: Payer: Self-pay | Admitting: Internal Medicine

## 2012-12-27 ENCOUNTER — Encounter (INDEPENDENT_AMBULATORY_CARE_PROVIDER_SITE_OTHER): Payer: Self-pay | Admitting: Emergency Medicine

## 2012-12-27 ENCOUNTER — Encounter: Payer: Self-pay | Admitting: Internal Medicine

## 2012-12-29 ENCOUNTER — Ambulatory Visit: Payer: Self-pay | Admitting: Pain Medicine

## 2013-01-02 ENCOUNTER — Telehealth: Payer: Self-pay | Admitting: Internal Medicine

## 2013-01-02 NOTE — Telephone Encounter (Signed)
Patient notified as requested of lab results.

## 2013-01-02 NOTE — Telephone Encounter (Signed)
All labs normal,. Including\ cholesterol

## 2013-01-26 ENCOUNTER — Ambulatory Visit: Payer: Self-pay | Admitting: Pain Medicine

## 2013-01-29 ENCOUNTER — Encounter: Payer: Self-pay | Admitting: Internal Medicine

## 2013-02-06 ENCOUNTER — Ambulatory Visit (INDEPENDENT_AMBULATORY_CARE_PROVIDER_SITE_OTHER): Payer: BC Managed Care – PPO | Admitting: Internal Medicine

## 2013-02-06 ENCOUNTER — Encounter: Payer: Self-pay | Admitting: Internal Medicine

## 2013-02-06 VITALS — BP 108/72 | HR 89 | Temp 97.7°F | Resp 16 | Ht 64.0 in | Wt 180.5 lb

## 2013-02-06 DIAGNOSIS — D0590 Unspecified type of carcinoma in situ of unspecified breast: Secondary | ICD-10-CM

## 2013-02-06 DIAGNOSIS — D0510 Intraductal carcinoma in situ of unspecified breast: Secondary | ICD-10-CM

## 2013-02-06 DIAGNOSIS — F4310 Post-traumatic stress disorder, unspecified: Secondary | ICD-10-CM

## 2013-02-06 DIAGNOSIS — E669 Obesity, unspecified: Secondary | ICD-10-CM

## 2013-02-06 HISTORY — DX: Obesity, unspecified: E66.9

## 2013-02-06 NOTE — Assessment & Plan Note (Signed)
She had become severely deconditioned for the past 7 years and has been under the caqre of psychiatry and psychology.

## 2013-02-06 NOTE — Progress Notes (Signed)
Patient ID: [name], female   DOB: [birth year], 42 y.o.   MRN: [id number]   Patient Active Problem List   Diagnosis Date Noted  . Obesity, unspecified 02/06/2013  . 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 Panic disorder 07/01/2011  . Post traumatic stress disorder 05/15/2011  . ASTHMA, EXERCISE INDUCED 05/25/2007  . GERD 05/25/2007  . IRRITABLE BOWEL SYNDROME 05/25/2007  . FIBROMYALGIA 05/25/2007    Subjective:  CC:   Chief Complaint  Patient presents with  . Annual Exam    HPI:   [name] a 42 y.o. female who presents for her annual nongyn exam.  Multiple ongoing issues.   1) Allergic rhinitis with sinus congestion.  Using Qnasal .   No symptoms of sinusitis.   2) Still having insomnia,  Occasionally entire night,  Averages 4 hours of sleep per night. Usually takes a mid day nap. Seeing psychiatry for mamangement of mood disorder, along with weekly sessions with a female  counsellor Tessie Fass.  prolonged grief has not  Dealt with loss of father yet.  zoloft was increased to 200 mg recently . 3) Back pain:  seeing Dr crisp for pain conrol ,  Who  has recommended Rheumatology referral pending at [facility] august 27th.   4) Obesity:  Has gained 40 lbs in the last 2 years due to inactivity and depression. Using treadmill and swimming .   5) Hyperlipidemia:  Tolerating zocor.,  LFTs were normal last month and lipids were reportedly normal    Past Medical History  Diagnosis Date  . Herniated disc     Degenerative, Lumbar disc  . Asthma     exercise induced  . Fibromyalgia   . Fracture 1993    Left Foot  . GERD (gastroesophageal reflux disease)     hiatal hernia  . Whiplash 2005    secondary to rear end collison  . Panic attack 2008    hospitalized  . IBS (irritable bowel syndrome) 2008    stress induced constipation/diarrhea  . Major depressive disorder 2001, 1996    was out of work for 3 and 6 months  . Cystitis     recurrent,  precipitated by sexual intercourse  . Migraine     tension  . Basal cell carcinoma 2007    Left Breast  . Bowel trouble 1994    IBS  . PMDD (premenstrual dysphoric disorder)   . Bronchitis 2000  . Bipolar 2 disorder 2012  . Panic attack 2012  . IBS (irritable bowel syndrome)   . Breast cancer 2013  . Squamous cell carcinoma 2008    Past Surgical History  Procedure Laterality Date  . Cesarean section  2007    emergency  . Laparoscopic endometriosis fulguration  202    Dr. Luella Cook  . Wisdom tooth extraction  1991    x 4  . Colonoscopy  2008    [place]  . Breast lumpectomy  2013    right breast ,DCIS, ER/PR negative   . Vaginal hysterectomy  2013  . Cystoscopy  2013       The following portions of the patient's history were reviewed and updated as appropriate: Allergies, current medications, and problem list.    Review of Systems:   12 Pt  review of systems was negative except those addressed in the HPI,     History   Social History  . Marital Status: Married    Spouse Name: N/A    Number of  Children: N/A  . Years of Education: N/A   Occupational History  . Psychologist, sport and exercise for insurance   Social History Main Topics  . Smoking status: Former Smoker    Quit date: 03/29/1998  . Smokeless tobacco: Never Used  . Alcohol Use: Yes     Comment: rare  . Drug Use: No  . Sexually Active: Not on file   Other Topics Concern  . Not on file   Social History Narrative  . No narrative on file    Objective:  BP 108/72  Pulse 89  Temp(Src) 97.7 F (36.5 C) (Oral)  Resp 16  Ht 5\' 4"  (1.626 m)  Wt 180 lb 8 oz (81.874 kg)  BMI 30.97 kg/m2  SpO2 98%  LMP 09/14/2011  General appearance: alert, cooperative and appears stated age Ears: normal TM's and external ear canals both ears Throat: lips, mucosa, and tongue normal; teeth and gums normal Neck: no adenopathy, no carotid bruit, supple, symmetrical, trachea midline and thyroid not enlarged,  symmetric, no tenderness/mass/nodules Back: symmetric, no curvature. ROM normal. No CVA tenderness. Lungs: clear to auscultation bilaterally Heart: regular rate and rhythm, S1, S2 normal, no murmur, click, rub or gallop Abdomen: soft, non-tender; bowel sounds normal; no masses,  no organomegaly Pulses: 2+ and symmetric Skin: Skin color, texture, turgor normal. No rashes or lesions Lymph nodes: Cervical, supraclavicular, and axillary nodes normal.  Assessment and Plan:  Post traumatic stress disorder She had become severely deconditioned for the past 7 years and has been under the caqre of psychiatry and psychology.  Obesity, unspecified I have addressed  BMI and recommended a low glycemic index diet utilizing smaller more frequent meals to increase metabolism.  I have also recommended that patient start exercising with a goal of 30 minutes of aerobic exercise a minimum of 5 days per week. Screening for lipid disorders, thyroid and diabetes to be done today.    Ductal carcinoma in situ of breast She is up to to date on follow up mammography    Updated Medication List Outpatient Encounter Prescriptions as of 02/06/2013  Medication Sig Dispense Refill  . acetaminophen-codeine (TYLENOL #4) 300-60 MG per tablet Take 1 tablet by mouth every 6 (six) hours as needed.  60 tablet  0  . albuterol (PROVENTIL,VENTOLIN) 90 MCG/ACT inhaler Inhale 2 puffs into the lungs 4 (four) times daily as needed.       Marland Kitchen albuterol (VENTOLIN HFA) 108 (90 BASE) MCG/ACT inhaler USE 1 PUFF EVERY 6 HOURS IF NEEDED  18 each  0  . ALPRAZolam (XANAX) 0.5 MG tablet Take 1 tablet (0.5 mg total) by mouth daily as needed.  30 tablet  2  . atorvastatin (LIPITOR) 20 MG tablet Take 1 tablet (20 mg total) by mouth daily.  90 tablet  3  . Beclomethasone Dipropionate (QNASL) 80 MCG/ACT AERS Place into the nose.      . Calcium Carbonate Antacid (TUMS PO) Take 1,500 mg by mouth 2 (two) times daily.      . cetirizine (ZYRTEC) 10 MG  tablet Take 10 mg by mouth daily.        . clonazePAM (KLONOPIN) 1 MG tablet Take 1.5 tablets (1.5 mg total) by mouth at bedtime.  45 tablet  5  . cyclobenzaprine (FLEXERIL) 10 MG tablet TAKE 1 TABLET BY MOUTH TWICE A DAY AS NEEDED FOR MUSCLE SPASM  60 tablet  5  . phenazopyridine (PYRIDIUM) 200 MG tablet Take 200 mg by mouth 2 (two) times daily  as needed. For dysuria       . Probiotic Product (PROBIOTIC PO) Take by mouth at bedtime.        . sertraline (ZOLOFT) 100 MG tablet TAKE 1 & 1/2 TABLETS BY MOUTH EVERY DAY  45 tablet  5  . nitrofurantoin, macrocrystal-monohydrate, (MACROBID) 100 MG capsule Take 100 mg by mouth 2 (two) times daily as needed. For dysuria       . norethindrone (MICRONOR,CAMILA,ERRIN) 0.35 MG tablet Take 1 tablet by mouth daily.      Marland Kitchen omeprazole (PRILOSEC) 40 MG capsule Take 1 capsule (40 mg total) by mouth 2 (two) times daily.  60 capsule  2   No facility-administered encounter medications on file as of 02/06/2013.     Orders Placed This Encounter  Procedures  . HM MAMMOGRAPHY  . HM PAP SMEAR    No Follow-up on file.

## 2013-02-06 NOTE — Patient Instructions (Addendum)
You can stop the atorvastatin and have your fasting lipids rechecked after a minimum of 8 weeks   This is  One version of a  "Low GI"  Diet:  It will still lower your triglycerides and allow you to lose 4 to 8  lbs  per month if you follow it carefully.  Your goal with exercise is a minimum of 30 minutes of aerobic exercise 5 days per week (Walking does not count once it becomes easy!)    All of the foods can be found at grocery stores and in bulk at [employer].  The Atkins protein bars and shakes are available in more varieties at Target, WalMart and Lowe's Foods.     7 AM Breakfast:  Choose from the following:  Low carbohydrate Protein  Shakes (I recommend the EAS AdvantEdge "Carb Control" shakes  Or the low carb shakes by Atkins.    2.5 carbs   Arnold's "Sandwhich Thin"toasted  w/ peanut butter (no jelly: about 20 net carbs  "Bagel Thin" with cream cheese and salmon: about 20 carbs   a scrambled egg/bacon/cheese burrito made with Mission's "carb balance" whole wheat tortilla  (about 10 net carbs )   Avoid cereal and bananas, oatmeal and cream of wheat and grits. They are loaded with carbohydrates!   10 AM: high protein snack  Protein bar by Atkins (the snack size, under 200 cal, usually < 6 net carbs).    A stick of cheese:  Around 1 carb,  100 cal     Dannon Light n Fit [place] Yogurt  (80 cal, 8 carbs)  Other so called "protein bars" and Greek yogurts tend to be loaded with carbohydrates.  Remember, in food advertising, the word "energy" is synonymous for " carbohydrate."  Lunch:   A Sandwich using the bread choices listed, Can use any  Eggs,  lunchmeat, grilled meat or canned tuna), avocado, regular mayo/mustard  and cheese.  A Salad using blue cheese, ranch,  Goddess or vinagrette,  No croutons or "confetti" and no "candied nuts" but regular nuts OK.   No pretzels or chips.  Pickles and miniature sweet peppers are a good low carb alternative that provide a "crunch"  The bread is the  only source of carbohydrate in a sandwich and  can be decreased by trying some of these alternatives to traditional loaf bread  Joseph's makes a pita bread and a flat bread that are 50 cal and 4 net carbs available at BJs and WalMart.  This can be toasted to use with hummous as well  Toufayan makes a low carb flatbread that's 100 cal and 9 net carbs available at [employer] and [employer] makes 2 sizes of  Low carb whole wheat tortilla  (The large one is 210 cal and 6 net carbs) Avoid "Low fat dressings, as well as Reyne Dumas and [address] dressings They are loaded with sugar!   3 PM/ Mid day  Snack:  Consider  1 ounce of  almonds, walnuts, pistachios, pecans, peanuts,  Macadamia nuts or a nut medley.  Avoid "granola"; the dried cranberries and raisins are loaded with carbohydrates. Mixed nuts as long as there are no raisins,  cranberries or dried fruit.     6 PM  Dinner:     Meat/fowl/fish with a green salad, and either broccoli, cauliflower, green beans, spinach, brussel sprouts or  Lima beans. DO NOT BREAD THE PROTEIN!!      There is a low carb pasta by  Dreamfield's that is acceptable and tastes great: only 5 digestible carbs/serving.( All grocery stores but BJs carry it )  Try Kai Levins Angelo's chicken piccata or chicken or eggplant parm over low carb pasta.(Lowes and BJs)   Clifton Custard Sanchez's "Carnitas" (pulled pork, no sauce,  0 carbs) or his beef pot roast to make a dinner burrito (at BJ's)  Pesto over low carb pasta (bj's sells a good quality pesto in the center refrigerated section of the deli   Whole wheat pasta is still full of digestible carbs and  Not as low in glycemic index as Dreamfield's.   Brown rice is still rice,  So skip the rice and noodles if you eat [place] or [place] (or at least limit to 1/2 cup)  9 PM snack :   Breyer's "low carb" fudgsicle or  ice cream bar (Carb Smart line), or  Weight Watcher's ice cream bar , or another "no sugar added" ice cream;  a serving of  fresh berries/cherries with whipped cream   Cheese or DANNON'S LlGHT N FIT [place] YOGURT  Avoid bananas, pineapple, grapes  and watermelon on a regular basis because they are high in sugar.  THINK OF THEM AS DESSERT  Remember that snack Substitutions should be less than 10 NET carbs per serving and meals < 20 carbs. Remember to subtract fiber grams to get the "net carbs."

## 2013-02-06 NOTE — Assessment & Plan Note (Signed)
She is up to to date on follow up mammography

## 2013-02-06 NOTE — Assessment & Plan Note (Signed)
I have addressed  BMI and recommended a low glycemic index diet utilizing smaller more frequent meals to increase metabolism.  I have also recommended that patient start exercising with a goal of 30 minutes of aerobic exercise a minimum of 5 days per week. Screening for lipid disorders, thyroid and diabetes to be done today.   

## 2013-02-10 ENCOUNTER — Encounter: Payer: Self-pay | Admitting: Internal Medicine

## 2013-02-14 ENCOUNTER — Encounter: Payer: Self-pay | Admitting: Internal Medicine

## 2013-02-21 ENCOUNTER — Ambulatory Visit: Payer: Self-pay | Admitting: Pain Medicine

## 2013-02-24 ENCOUNTER — Other Ambulatory Visit: Payer: Self-pay | Admitting: Internal Medicine

## 2013-03-30 ENCOUNTER — Ambulatory Visit: Payer: Self-pay | Admitting: Pain Medicine

## 2013-04-02 ENCOUNTER — Encounter: Payer: Self-pay | Admitting: Internal Medicine

## 2013-04-27 ENCOUNTER — Ambulatory Visit: Payer: Self-pay | Admitting: Pain Medicine

## 2013-05-23 ENCOUNTER — Ambulatory Visit: Payer: Self-pay | Admitting: Pain Medicine

## 2013-06-08 ENCOUNTER — Other Ambulatory Visit: Payer: Self-pay

## 2013-06-21 ENCOUNTER — Ambulatory Visit: Payer: Self-pay | Admitting: Pain Medicine

## 2013-10-10 ENCOUNTER — Encounter: Payer: Self-pay | Admitting: General Surgery

## 2013-10-10 ENCOUNTER — Ambulatory Visit: Payer: Self-pay | Admitting: General Surgery

## 2013-10-10 ENCOUNTER — Telehealth: Payer: Self-pay | Admitting: *Deleted

## 2013-10-10 NOTE — Telephone Encounter (Signed)
Phone call from radiology department. Patient represented with right breast palpable mass, history of right breast lumpectomy and radiation. 4.5 cm calcifications 9 o'clock retro areolar area highly suspicious and he recommends stereotatic biopsy. She has her annual appointment for 10-23-13. Do we need to see her sooner?

## 2013-10-10 NOTE — Telephone Encounter (Signed)
Move appt up to Friday, 9:30 AM.

## 2013-10-11 ENCOUNTER — Ambulatory Visit: Payer: Self-pay | Admitting: Unknown Physician Specialty

## 2013-10-13 ENCOUNTER — Encounter: Payer: Self-pay | Admitting: General Surgery

## 2013-10-13 ENCOUNTER — Ambulatory Visit (INDEPENDENT_AMBULATORY_CARE_PROVIDER_SITE_OTHER): Payer: Medicaid Other | Admitting: General Surgery

## 2013-10-13 ENCOUNTER — Other Ambulatory Visit: Payer: Medicaid Other

## 2013-10-13 VITALS — BP 130/74 | HR 76 | Resp 14 | Ht 63.0 in | Wt 169.0 lb

## 2013-10-13 DIAGNOSIS — N63 Unspecified lump in unspecified breast: Secondary | ICD-10-CM

## 2013-10-13 DIAGNOSIS — D0590 Unspecified type of carcinoma in situ of unspecified breast: Secondary | ICD-10-CM

## 2013-10-13 NOTE — Patient Instructions (Addendum)
The patient is aware to call back for any questions or concerns. Patient to return in 1 week for nurse visit.       CARE AFTER BREAST BIOPSY  1. Leave the dressing on that your doctor applied after surgery. It is waterproof. You may bathe, shower and/or swim. The dressing will probably remain intact until your return office visit. If the dressing comes off, you will see small strips of tape against your skin on the incision. Do not remove these strips.  2. You may want to use a gauze,cloth or similar protection in your bra to prevent rubbing against your dressing and incision. This is not necessary, but you may feel more comfortable doing so.  3. It is recommended that you wear a bra day and night to give support to the breast. This will prevent the weight of the breast from pulling on the incision.  4. Your breast will feel hard and lumpy under the incision. Do not be alarmed. This is the underlying stitching of tissue. Softening of this tissue will occur in time.  5. Make sure you call the office and schedule an appointment in one week after your surgery. The office phone number is [phone]. The nurses at [facility] may have already done this for you.  6. You will notice about a week after your office visit that the strips of the tape on your incision will begin to loosen. These may then be removed.  7. Report to your doctor any of the following:  * Severe pain not relieved by your pain medication  *Redness of the incision  * Drainage from the incision  *Fever greater than 101 degrees  The patient has been asked to have a unilateral right breast diagnostic mammogram post biopsy. This has been arranged for this afternoon at 2 pm at [facility]. She is aware of all instructions.   Patient will follow up for a nurse check next week in the office.
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
## 2013-10-14 ENCOUNTER — Encounter: Payer: Self-pay | Admitting: General Surgery

## 2013-10-15 ENCOUNTER — Telehealth: Payer: Self-pay | Admitting: General Surgery

## 2013-10-15 NOTE — Telephone Encounter (Signed)
Message left that post biopsy mammogram still shows calcifications ( mammogram and ultrasound areas separate) and that she will need a right stereo biopsy.  Possibly can be completed 3/16 if last stereo slot is still available. Asked to call first thing in AM.

## 2013-10-16 LAB — PATHOLOGY

## 2013-10-16 NOTE — Telephone Encounter (Signed)
Patient called in this morning and wanted to see if she could have stereo biopsy completed this afternoon.   A phone call was made to Angela Nevin at the [facility], who states that slot is not available for this afternoon. Estill Bamberg also states their time slots for 10-23-13 are full.) Next available date is 10-30-13 and then 11-13-13.  Patient was offered to have biopsy completed on 10-30-13 but declined because she is having a colonoscopy that day. This patient was also given the choice to have stereo on 11-13-13 but states she does not want to wait that long. She wishes to call her friend because she just went through this and may want a referral to [facility]. Patient to find out the doctor's contact information and sign a release for medical records.

## 2013-10-17 ENCOUNTER — Telehealth: Payer: Self-pay | Admitting: *Deleted
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
## 2013-10-17 NOTE — Telephone Encounter (Signed)
Patient agreeable to right breast stereo biopsy at [facility] for 10-30-13 at 4 pm (arrive 3:30 pm). Trish in endoscopy aware patient will need to be finished by this time (Dr. Vira Agar to complete colonoscopy). Instructions reviewed verbally with the patient by phone. Paperwork will be mailed to the patient for her review. Patient also aware to stop by and sign a consent form prior to stereo. [facility] films have been requested and [facility] will pick up later this week. Patient was instructed to call the office should she have further questions.

## 2013-10-19 ENCOUNTER — Encounter: Payer: Self-pay | Admitting: General Surgery

## 2013-10-20 ENCOUNTER — Ambulatory Visit (INDEPENDENT_AMBULATORY_CARE_PROVIDER_SITE_OTHER): Payer: Medicaid Other | Admitting: *Deleted

## 2013-10-20 DIAGNOSIS — N63 Unspecified lump in unspecified breast: Secondary | ICD-10-CM

## 2013-10-20 NOTE — Progress Notes (Signed)
Patient here today for follow up post right  breast biopsy.  Dressing removed, steristrip in place and aware it may come off in one week.  Minimal bruising noted.  The patient is aware that a heating pad may be used for comfort as needed.  Aware of pathology. Follow up as scheduled. 

## 2013-10-23 ENCOUNTER — Ambulatory Visit: Payer: Self-pay | Admitting: General Surgery

## 2013-10-30 ENCOUNTER — Ambulatory Visit: Payer: Self-pay | Admitting: General Surgery

## 2013-10-30 DIAGNOSIS — R920 Mammographic microcalcification found on diagnostic imaging of breast: Secondary | ICD-10-CM

## 2013-11-01 ENCOUNTER — Telehealth: Payer: Self-pay | Admitting: General Surgery

## 2013-11-01 LAB — PATHOLOGY REPORT

## 2013-11-01 NOTE — Telephone Encounter (Signed)
The patient was contacted with the results of her recently completed stereotactic right breast biopsy from March 30 tablet 2015. This showed recurrent high-grade DCIS, similar to her 2013 biopsy.  Arrangements are in place to get together tomorrow morning at 8:30 AM to reviewed treatment options.

## 2013-11-02 ENCOUNTER — Ambulatory Visit (INDEPENDENT_AMBULATORY_CARE_PROVIDER_SITE_OTHER): Payer: Medicaid Other | Admitting: General Surgery

## 2013-11-02 ENCOUNTER — Encounter: Payer: Self-pay | Admitting: General Surgery

## 2013-11-02 DIAGNOSIS — D0590 Unspecified type of carcinoma in situ of unspecified breast: Secondary | ICD-10-CM

## 2013-11-02 DIAGNOSIS — D0510 Intraductal carcinoma in situ of unspecified breast: Secondary | ICD-10-CM

## 2013-11-02 LAB — PATHOLOGY REPORT

## 2013-11-02 NOTE — Progress Notes (Signed)
Patient ID: [name], female   DOB: 1971-02-21, 43 y.o.   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 She had been notified of the results by phone yesterday.  She reports that she's had minimal discomfort postbiopsy, although she had significant discomfort during the procedure.  We reviewed the pathology showing recurrent or he was emphasized to the patient that she has plenty of time to make a decision regarding her treatment options.  Sheet initially spoken on the phone last night about bilateral mastectomy. The left breast as never had any pathologic process, and we reviewed the anticipated surgical needs for management of the right breast and the risks of an extended operative procedure on the left side.  The patient will require a simple mastectomy with sentinel node biopsy (on the off chance that an invasive malignancy is identified). He does of her previous whole breast radiation she will likely not be a candidate for simple expander placement.  She wishes to see a physician at [facility] in this regard, Jodean Lima, M.D.. His office will be contacted to arrange an appointment.

## 2013-11-07 ENCOUNTER — Ambulatory Visit: Payer: Medicaid Other

## 2013-12-19 ENCOUNTER — Telehealth: Payer: Self-pay | Admitting: *Deleted

## 2013-12-19 NOTE — Telephone Encounter (Signed)
Message copied by Carson Myrtle on Tue Dec 19, 2013  8:25 AM ------      Message from: Garland, Forest Gleason      Created: Sun Dec 17, 2013 11:27 AM       Please contact patient and see if she has had her surgery (recurrent DCIS, was going to [facility] for mastectomy and immediate reconstruction. Thanks ------

## 2013-12-19 NOTE — Telephone Encounter (Signed)
I talked with the patient and she had the surgery 12-05-13 at [facility]. She had GI issues while in the hospital and was there 6 days. Her reconstruction process has started under the care of Dr Stevphen Rochester. She states she is doing well and making progress. Appreciates our phone call and to tell Dr. Bary Castilla Thank you.

## 2013-12-20 DIAGNOSIS — C50919 Malignant neoplasm of unspecified site of unspecified female breast: Secondary | ICD-10-CM

## 2013-12-20 HISTORY — DX: Malignant neoplasm of unspecified site of unspecified female breast: C50.919

## 2014-02-11 ENCOUNTER — Other Ambulatory Visit: Payer: Self-pay | Admitting: Internal Medicine

## 2014-06-04 ENCOUNTER — Encounter: Payer: Self-pay | Admitting: General Surgery

## 2014-11-20 NOTE — Op Note (Signed)
PATIENT NAME:  [name], [name] MR#:  [id number] DATE OF BIRTH:  [birth year]  DATE OF PROCEDURE:  04/05/2012  PREOPERATIVE DIAGNOSES: Menorrhagia, history of breast cancer.   POSTOPERATIVE DIAGNOSES: Menorrhagia, history of breast cancer.   PROCEDURE: Laparoscopic supracervical hysterectomy, bilateral salpingo-oophorectomy.   SURGEON: Wonda Cheng. Laurey Morale, MD  FIRST ASSISTANT: Erik Obey, MD   OPERATIVE FINDINGS: Large multiparous size uterus, many fairly dense adhesions from the right fundus to the anterior abdominal wall.   DESCRIPTION OF PROCEDURE: After adequate general anesthesia, the patient was prepped and draped in routine fashion. An infraumbilical incision was made through the skin and approximately 3 liters of carbon dioxide were insufflated without incident. During insufflation the uterine manipulator was placed and the bladder was drained. The laparoscope was inserted. The above findings were noted. Accessory ports were placed in the right lower quadrant. The right cornu was grasped and the infundibulopelvic ligament was coagulated with Harmonic scalpel and divided. Broad ligaments were serially clamped and divided with the Harmonic scalpel, as were the round ligaments. The adhesions to the anterior abdominal wall were carefully dissected free with the Harmonic scalpel. The uterine vessels were skeletonized and cauterized with bipolar forceps on several occasions. A like procedure was carried out on the other side. The specimen was amputated. The endocervix was cauterized. The uterine specimen, tubes and ovaries were removed with the morcellator. The pelvis was lavaged with copious of normal saline and all areas of surgery were inspected and found to be hemostatic. Interceed was placed, CO2 was allowed to escape, and the skin was closed in routine fashion. The patient         tolerated the procedure well and left the Operating Room in good condition. Sponge and needle counts were  said be correct at the end of the procedure. Estimated blood loss 50 mL. ____________________________ Wonda Cheng. Laurey Morale, MD pjr:slb D: 04/05/2012 10:32:21 ET T: 04/05/2012 11:02:57 ET JOB#: [id number]  cc: Wonda Cheng. Laurey Morale, MD, <Dictator> Rosina Lowenstein MD ELECTRONICALLY SIGNED 04/06/2012 5:04

## 2014-11-25 NOTE — Op Note (Signed)
PATIENT NAME:  [name], FODGE MR#:  [id number] DATE OF BIRTH:  Jun 09, 1971  DATE OF PROCEDURE:  11/30/2011  PREOPERATIVE DIAGNOSES:  1. Pseudomembranous trigonitis.  2. Urethral polyps.  3. Urethral stricture. 4. Interstitial cystitis.   POSTOPERATIVE DIAGNOSES:  1. Pseudomembranous trigonitis.  2. Urethral polyps.  3. Urethral stricture. 4. Interstitial cystitis.   OPERATION:  1. Cystoscopy.  2. Hydrodilation of the bladder. 3. Fulguration of the trigone.  4. Fulguration of urethral polyps.  5. Urethral dilatation.   SURGEON: Alcus Dad, MD   ANESTHESIA: General.   INDICATIONS: This patient, with a previous diagnosis of interstitial cystitis, has had hydrodilation in the distant past with excellent relief of symptoms for a number of years. Her symptoms have recurred, and she has requested reconsideration for that procedure. 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   DESCRIPTION OF PROCEDURE: In the dorsal lithotomy position under general anesthesia, the lower abdomen and genital area was prepped and draped for endoscopic work. The urethra was noted to have been of narrow caliber previously. Dilatation was done to allow passage of the 21 cystoscope. As previously noted, bullous trigonitis and urethral polyps are present. Hydrodilation of the bladder was performed, the bladder accepting 400, 450 and 500 mL, respectively, with numerous glomerulations occurring. Following hydrodilation, the Timberlake electrode was introduced in the trigone area containing pseudomembranous changes, and bullous formations were carefully cauterized. The proximal urethra containing numerous polyps was carefully cauterized. Following this, the urethra was dilated successively to 30-French. A 16-French Foley catheter was left indwelling. The patient tolerated the procedure well and returned to the recovery area in satisfactory condition.    ____________________________ Alcus Dad, MD jsh:cbb D: 11/30/2011 15:19:44 ET T: 11/30/2011 16:10:57 ET JOB#: [id number]  cc: Alcus Dad, MD, <Dictator> Alcus Dad MD ELECTRONICALLY SIGNED 12/03/2011 13:30

## 2014-11-25 NOTE — Consult Note (Signed)
Reason for Visit: This 44 year old Female patient presents to the clinic for initial evaluation of  Ductal carcinoma in situ .   Referred by Dr. Hervey Ard.  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 Initial biopsy was positive for ductal carcinoma in situ. ER/PR status has not been reported. She underwent a wide local excision for a 0.9 cm focus of ductal carcinoma in situ comedo type. Margins were clear but close at 0.2 cm. Tumor was nuclear grade 3. She tolerated her surgery well. Patient is currently on disability for bereavment secondary to fathers deatht. She specifically denies breast tenderness cough or bone pain. She has had BRCA1 testing which was negative.  Past Hx:    Multiple Skin Cancers:    MRSA: sinuses, 2010   GERD - Esophageal Reflux:    Hiatal Hernia:    Lower back problems:    Anemia:    Right breast cancer:    Depression:    Panic Attacks:    Migraines:    Panic Attacks:    Anxiety:    Asthma:    Fibromyalgia:    Skin cancer left breast removed:    Cystoscopy with hydrodilation:    Nasal Surgery: x2   Cesarean Section:   Past, Family and Social History:   Past Medical History positive    Respiratory asthma    Gastrointestinal GERD; Hiatal hernia, irritable bowel    Genitourinary Cystoscopy with high-grade dilatation, interstitial cystitis    Neurological/Psychiatric anxiety; depression; migraine; Packet asked, anxiety    Past Surgical History Nasal surgery, cesarean section, skin  cancer from left breast removed    Past Medical History Comments Fibromyalgia,MRSA    Family History positive    Family History Comments Grandmother with coronary artery disease and lung cancer, father with brain cancer, mother with COPD hypertension and hyperlipidemia    Social History positive    Social History Comments Proximity 30-pack-year smoking history quit in 2000, social EtOH use history    Additional Past Medical and Surgical History Accompanied by mother today   Allergies:   Percocet: Hives  Ibuprofen: Other  Prozac: Unknown  Other -Explain in Comment: Swelling, Hives  Aspirin: Other  Home Meds:  Home Medications: Medication Instructions Status  Zyrtec 10 mg oral tablet 1 tab(s) orally once a day Active  Probiotic Formula oral capsule 1 cap(s) orally once a day Active  Zoloft 25 mg oral tablet 1 tab(s) orally once a day Active  Klonopin 1.5 milligram(s) orally once a day (at bedtime) Active  Tylenol with Codeine #4 oral tablet 1 tab(s) orally every 6 hours, As Needed Active  Xanax 0.5 mg oral tablet 1 tab(s) orally 3 times a day, As Needed Active  Flexeril 10 mg oral tablet 1 tab(s) orally 3 times a day, As Needed Active  albuterol-ipratropium 103 mcg-18 mcg/inh inhalation aerosol 2 puff(s) inhaled 4 times a day, As Needed Active  Qnasl 80 mcg/inh nasal spray 2 spray(s) nasal once a day, As Needed Active  Camila 0.35 mg oral tablet 1 tab(s) orally once a  day Active  Prilosec 40 mg oral delayed release capsule 1 cap(s) orally once a day Active  Tums Ultra 1000 mg oral tablet, chewable 2 tab(s) orally once a day Active   Review of Systems:   General negative    Performance Status (ECOG) 0    Skin negative    Breast see HPI    Ophthalmologic negative    ENMT negative    Respiratory and Thorax negative    Cardiovascular see HPI    Gastrointestinal see HPI    Genitourinary see HPI    Musculoskeletal see HPI    Neurological negative    Psychiatric  see HPI    Hematology/Lymphatics negative    Endocrine negative    Allergic/Immunologic negative   Nursing Notes:  Nursing Vital Signs and Chemo Nursing Nursing Notes: *CC Vital Signs Flowsheet:   17-Apr-13 08:56   Temp Temperature 98.5   Pulse Pulse 125   Respirations Respirations 20   SBP SBP 125   DBP DBP 84   Pain Scale (0-10)  1   Current Weight (kg) (kg) 73.3   Height (cm) centimeters 160   BSA (m2) 1.7   Physical Exam:  General/Skin/HEENT:   General normal    Skin normal    Eyes normal    ENMT normal    Head and Neck normal    Additional PE Well-developed female in NAD. She status post wide local excision of the right breast with incision healing well. No dominant mass or nodularity is noted in either breast into position examined. Lungs are clear to A&P cardiac examination shows regular rate and rhythm. No axillary or supraclavicular adenopathy is appreciated.   Breasts/Resp/CV/GI/GU:   Respiratory and Thorax normal    Cardiovascular normal    Gastrointestinal normal    Genitourinary normal   MS/Neuro/Psych/Lymph:   Musculoskeletal normal    Neurological normal    Lymphatics normal   Other Results:  Radiology Results: [place]:    02-Feb-12 07:17, Digital Screen Mammogram   Digital Screen Mammogram    REASON FOR EXAM:    SCREENING  COMMENTS:  Submitted by practice: [facility] Scheduled by user:   Reece Levy     PROCEDURE: MAM - MAM DGTL SCREENING MAMMO W/CAD  - Sep 04 2010  7:17AM     RESULT: Comparison is made to previous digital studies of 08/06/2008 from   [place] [facility] [place].     The breasts exhibit a moderately dense parenchymal pattern. There is no   dominant mass. I see no malignant appearing grouping of   microcalcification.     IMPRESSION: I do not see findingssuspicious for malignancy.     BI-RADS: Category 2 - Benign Findings    RECOMMENDATION:  Please continue to encourage yearly mammographic    follow-up.      Thank you for this opportunity to contribute to the care of your patient.    A NEGATIVE MAMMOGRAM REPORT DOES NOT PRECLUDE BIOPSY OR OTHER EVALUATION   OF A CLINICALLY PALPABLE OR OTHERWISE SUSPICIOUS MASS OR LESION. BREAST   CANCER MAY NOT BE DETECTED BY MAMMOGRAPHY IN UP TO 10% OF CASES.           Verified By: DAVID A. Martinique, M.D., MD    08-Feb-13 11:18, Digital Additional Views Rt Breast (SCR)   Digital Additional Views Rt Breast (SCR)    REASON FOR EXAM:    AV RT MICROCALS  COMMENTS:       PROCEDURE: MAM -  MAM DIG ADDVIEWS RT [facility]  - Sep 11 2011 11:18AM     RESULT: TECHNIQUE: Digital diagnostic right mammograms were obtained. FDA   approved computer-aided detection (CAD) for mammography was utilized for   this study.    FINDING:      True lateral view and spot magnification views of the right breast were   performed. There is a cluster of pleomorphic microcalcifications in the   lateral posterior third of the right breast which are just inferior to   the posterior nipple line on the MLO view. Slightly anterior to the     forementioned cluster of microcalcifications there is a second cluster of   microcalcifications in the lateral right breast. There is no soft tissue   mass.    IMPRESSION:     1.    Two clusters of indeterminate microcalcifications in the lateral   posterior third of the right breast. Recommend stereotactic biopsy for   further evaluation.     These findings were communicated to the patient immediately following the   examination.    BI-RADS:  Category 4 - Suspicious Abnormality    A negative mammogram report does not preclude biopsy or other evaluation   of a clinically palpable or otherwise suspicious mass or lesion. Breast   cancer may not be detected by mammography in up to 10% of cases.    Thank you for the opportunity to contribute to the care of your patient.    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 ER/PR status pending  Plan:   at this time I have recommended whole breast radiation. She is not candidate for accelerated partial breast irradiation based on her young age. Risks and benefits of treatment including redness of the skin some increase fibrosis of the breast were all discussed with the patient in detail. I have set her up for CT simulation early next week. We will plan on delivering 5000 cGy over 5 weeks post a 1400 cGy scar boost. We will make further determination about aromatase inhibitor use i.e. tamoxifen after official report of hormone receptor status is made available. I would like to take this opportunity to thank you for allowing me to continue to participate in this patient's care.  CC Referral:   cc: Dr. Hervey Ard, Dr. Nicole Cella   Electronic Signatures: Baruch Gouty, Roda Shutters (MD)  (Signed 29-Apr-13 12:55)  Authored: HPI, Diagnosis, Past Hx, PFSH, Allergies, Home Meds, ROS, Nursing Notes, Physical Exam, Other Results, Encounter Assessment and Plan, CC Referring Physician   Last Updated: 29-Apr-13 12:55 by Armstead Peaks (MD)

## 2014-11-25 NOTE — Op Note (Signed)
PATIENT NAME:  [name], [name] MR#:  [id number] DATE OF BIRTH:  Feb 23, 1971  DATE OF PROCEDURE:  11/12/2011  PREOPERATIVE DIAGNOSIS: Ductal carcinoma in situ, right breast.   POSTOPERATIVE DIAGNOSIS: Ductal carcinoma in situ, right breast.   OPERATIVE PROCEDURE: Wide local excision, right breast cancer.   SURGEON: Hervey Ard, MD   ANESTHESIA: General endotracheal under Dr. Marcello Moores. Marcaine 0.5% with 1:200,000 units epinephrine 30 mL local infiltration.   ESTIMATED BLOOD LOSS: Less than 25 mL.   CLINICAL NOTE: This 44 year old woman had a mammogram showing evidence of microcalcifications. Stereotactic biopsy had shown evidence of DCIS. She was felt to be a candidate for wide excision and postoperative radiation therapy.   DESCRIPTION OF PROCEDURE: With the patient under adequate general endotracheal anesthesia, the breast was prepped with ChloraPrep and draped. Ultrasound was used to confirm the previous biopsy cavity, at the 9 o'clock position. This was approximately 1.5 to 2 cm below the skin surface. Preprocedure mammograms had showed a few residual calcifications just superficial of the biopsy cavity. A radial incision was made at the 8:30 o'clock position after local anesthesia had been infiltrated for postoperative comfort. The skin was incised sharply and the remaining dissection completed with electrocautery. Hemostasis was with electrocautery and 3-0 Vicryl ties. The adipose tissue was elevated at a distance of 1 to 1.5 cm below the skin around the biopsy cavity and extending just to the edge of the areolar tissue. The specimen was orientated and specimen radiograph confirmed that the residual calcifications were in the specimen as well as the original biopsy clip. Pathology reported the closest margin was 9 mm to the biopsy cavity. Meticulous hemostasis was achieved and the wound was closed in layers with interrupted 2-0 Vicryl figure-of-eight sutures. The skin was closed with a  running 4-0 Vicryl      subcuticular suture. Benzoin and Steri-Strips were applied followed by a fluff dressing, Kerlix, and Ace wrap.   The patient tolerated the procedure well.  ____________________________ Robert Bellow, MD jwb:[id number] D: 11/12/2011 13:42:36 ET T: 11/12/2011 15:04:52 ET JOB#: [id number]  cc: Robert Bellow, MD, <Dictator> Deborra Medina, MD JEFFREY Amedeo Kinsman MD ELECTRONICALLY SIGNED 11/13/2011 10:20

## 2015-01-29 DIAGNOSIS — Z9010 Acquired absence of unspecified breast and nipple: Secondary | ICD-10-CM | POA: Insufficient documentation

## 2015-01-29 HISTORY — DX: Acquired absence of unspecified breast and nipple: Z90.10

## 2015-05-02 DIAGNOSIS — D0510 Intraductal carcinoma in situ of unspecified breast: Secondary | ICD-10-CM

## 2015-05-30 ENCOUNTER — Inpatient Hospital Stay: Payer: Medicaid Other | Attending: Hematology and Oncology | Admitting: Hematology and Oncology

## 2015-05-30 VITALS — BP 124/85 | HR 79 | Temp 97.4°F | Resp 18 | Ht 63.0 in | Wt 176.4 lb

## 2015-05-30 DIAGNOSIS — D125 Benign neoplasm of sigmoid colon: Secondary | ICD-10-CM | POA: Diagnosis not present

## 2015-05-30 DIAGNOSIS — Z923 Personal history of irradiation: Secondary | ICD-10-CM | POA: Diagnosis not present

## 2015-05-30 DIAGNOSIS — Z171 Estrogen receptor negative status [ER-]: Secondary | ICD-10-CM | POA: Diagnosis not present

## 2015-05-30 DIAGNOSIS — M797 Fibromyalgia: Secondary | ICD-10-CM

## 2015-05-30 DIAGNOSIS — Z90710 Acquired absence of both cervix and uterus: Secondary | ICD-10-CM | POA: Diagnosis not present

## 2015-05-30 DIAGNOSIS — Z9013 Acquired absence of bilateral breasts and nipples: Secondary | ICD-10-CM | POA: Diagnosis not present

## 2015-05-30 DIAGNOSIS — Z79899 Other long term (current) drug therapy: Secondary | ICD-10-CM | POA: Diagnosis not present

## 2015-05-30 DIAGNOSIS — K219 Gastro-esophageal reflux disease without esophagitis: Secondary | ICD-10-CM

## 2015-05-30 DIAGNOSIS — R5383 Other fatigue: Secondary | ICD-10-CM | POA: Diagnosis not present

## 2015-05-30 DIAGNOSIS — Z8051 Family history of malignant neoplasm of kidney: Secondary | ICD-10-CM | POA: Insufficient documentation

## 2015-05-30 DIAGNOSIS — D0511 Intraductal carcinoma in situ of right breast: Secondary | ICD-10-CM

## 2015-05-30 DIAGNOSIS — M5136 Other intervertebral disc degeneration, lumbar region: Secondary | ICD-10-CM | POA: Insufficient documentation

## 2015-05-30 DIAGNOSIS — Z85828 Personal history of other malignant neoplasm of skin: Secondary | ICD-10-CM | POA: Insufficient documentation

## 2015-05-30 DIAGNOSIS — C50911 Malignant neoplasm of unspecified site of right female breast: Secondary | ICD-10-CM

## 2015-05-30 DIAGNOSIS — K589 Irritable bowel syndrome without diarrhea: Secondary | ICD-10-CM | POA: Insufficient documentation

## 2015-05-30 DIAGNOSIS — Z86000 Personal history of in-situ neoplasm of breast: Secondary | ICD-10-CM | POA: Insufficient documentation

## 2015-05-30 DIAGNOSIS — Z87891 Personal history of nicotine dependence: Secondary | ICD-10-CM

## 2015-05-30 DIAGNOSIS — D649 Anemia, unspecified: Secondary | ICD-10-CM

## 2015-05-30 DIAGNOSIS — J45909 Unspecified asthma, uncomplicated: Secondary | ICD-10-CM | POA: Insufficient documentation

## 2015-05-30 DIAGNOSIS — Z801 Family history of malignant neoplasm of trachea, bronchus and lung: Secondary | ICD-10-CM | POA: Insufficient documentation

## 2015-05-30 DIAGNOSIS — Z90722 Acquired absence of ovaries, bilateral: Secondary | ICD-10-CM | POA: Insufficient documentation
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
## 2015-07-10 ENCOUNTER — Ambulatory Visit: Payer: Self-pay

## 2015-07-18 ENCOUNTER — Telehealth: Payer: Self-pay | Admitting: Radiology

## 2015-07-18 ENCOUNTER — Ambulatory Visit (INDEPENDENT_AMBULATORY_CARE_PROVIDER_SITE_OTHER): Payer: Medicaid Other | Admitting: Urology

## 2015-07-18 ENCOUNTER — Encounter: Payer: Self-pay | Admitting: Urology

## 2015-07-18 VITALS — BP 121/83 | HR 91 | Ht 63.0 in | Wt 170.8 lb

## 2015-07-18 DIAGNOSIS — N390 Urinary tract infection, site not specified: Secondary | ICD-10-CM | POA: Diagnosis not present

## 2015-07-18 DIAGNOSIS — R3129 Other microscopic hematuria: Secondary | ICD-10-CM | POA: Diagnosis not present

## 2015-07-18 DIAGNOSIS — N3010 Interstitial cystitis (chronic) without hematuria: Secondary | ICD-10-CM | POA: Diagnosis not present

## 2015-07-18 LAB — BLADDER SCAN AMB NON-IMAGING

## 2015-07-18 LAB — URINALYSIS, COMPLETE
Bilirubin, UA: NEGATIVE
Glucose, UA: NEGATIVE
KETONES UA: NEGATIVE
Leukocytes, UA: NEGATIVE
NITRITE UA: NEGATIVE
PH UA: 6.5 (ref 5.0–7.5)
Protein, UA: NEGATIVE
Specific Gravity, UA: 1.025 (ref 1.005–1.030)
Urobilinogen, Ur: 0.2 mg/dL (ref 0.2–1.0)

## 2015-07-18 LAB — MICROSCOPIC EXAMINATION

## 2015-07-18 MED ORDER — CIPROFLOXACIN HCL 500 MG PO TABS
500.0000 mg | ORAL_TABLET | Freq: Once | ORAL | Status: DC
Start: 2015-07-18 — End: 2015-09-30

## 2015-07-18 NOTE — Telephone Encounter (Signed)
LMOM. Need to notify pt of surgery & PAT phone interview.
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
## 2015-07-18 NOTE — Telephone Encounter (Signed)
Notified pt of surgery scheduled 08/12/15, pre-admit phone interview on 08/02/15 between 1-5pm and to call Friday prior to surgery for arrival time to [facility]. Pt voices understanding.

## 2015-07-24 ENCOUNTER — Ambulatory Visit: Payer: Self-pay | Admitting: Urology

## 2015-08-02 ENCOUNTER — Encounter: Payer: Self-pay | Admitting: *Deleted

## 2015-08-02 ENCOUNTER — Other Ambulatory Visit: Payer: Medicaid Other

## 2015-08-02 NOTE — Patient Instructions (Signed)
  Your procedure is scheduled on: 08-12-15 (MONDAY) Report to [place] To find out your arrival time please call [phone] between 1PM - 3PM on 08-09-15 (FRIDAY)  Remember: Instructions that are not followed completely may result in serious medical risk, up to and including death, or upon the discretion of your surgeon and anesthesiologist your surgery may need to be rescheduled.    _X___ 1. Do not eat food or drink liquids after midnight. No gum chewing or hard candies.     _X___ 2. No Alcohol for 24 hours before or after surgery.   ____ 3. Bring all medications with you on the day of surgery if instructed.    _X___ 4. Notify your doctor if there is any change in your medical condition     (cold, fever, infections).     Do not wear jewelry, make-up, hairpins, clips or nail polish.  Do not wear lotions, powders, or perfumes. You may wear deodorant.  Do not shave 48 hours prior to surgery. Men may shave face and neck.  Do not bring valuables to the hospital.    [facility] is not responsible for any belongings or valuables.               Contacts, dentures or bridgework may not be worn into surgery.  Leave your suitcase in the car. After surgery it may be brought to your room.  For patients admitted to the hospital, discharge time is determined by your treatment team.   Patients discharged the day of surgery will not be allowed to drive home.   Please read over the following fact sheets that you were given:     _X___ Take these medicines the morning of surgery with A SIP OF WATER:    1. PRILOSEC  2. TAKE AN EXTRA PRILOSEC ON Sunday NIGHT  3.   4.  5.  6.  ____ Fleet Enema (as directed)   ____ Use CHG Soap as directed  _X___ Use inhalers on the day of surgery-USE ALBUTEROL INHALER AT [place]  ____ Stop metformin 2 days prior to surgery    ____ Take 1/2 of usual insulin dose the night before surgery and none on the morning  of surgery.   ____ Stop Coumadin/Plavix/aspirin-N/A  ____ Stop Anti-inflammatories-NO NSAIDS OR ASPIRIN PRODUCTS-OXYCODONE OK TO CONTINUE/TYLENOL OK TO TAKE   ____ Stop supplements until after surgery.    ____ Bring C-Pap to the hospital.

## 2015-08-06 ENCOUNTER — Encounter
Admission: RE | Admit: 2015-08-06 | Discharge: 2015-08-06 | Disposition: A | Discharge status: Home / Self Care | Payer: Medicaid Other | Source: Ambulatory Visit | Attending: Urology | Admitting: Urology

## 2015-08-06 DIAGNOSIS — Z01818 Encounter for other preprocedural examination: Secondary | ICD-10-CM | POA: Insufficient documentation

## 2015-08-06 LAB — SURGICAL PCR SCREEN
MRSA, PCR: NEGATIVE
Staphylococcus aureus: NEGATIVE

## 2015-08-09 ENCOUNTER — Telehealth: Payer: Self-pay | Admitting: Radiology

## 2015-08-09 NOTE — Telephone Encounter (Signed)
Pt called to r/s surgery scheduled 1/9 d/t weather. Pt notified of surgery r/s to 09/02/15, to follow instructions given in pre-admit testing, to be npo after mn day of surgery, and to call Friday prior to surgery for arrival time to [facility]. Pt voices understanding.

## 2015-08-23 ENCOUNTER — Telehealth: Payer: Self-pay | Admitting: Radiology

## 2015-08-23 NOTE — Telephone Encounter (Signed)
Notified pt of surgery r/s to 08/27/15, to be npo after mn day of surgery & to call day prior to surgery for arrival time to [facility]. Advised pt to continue using boric acid capsules as prescribed per Dr Erlene Quan. Pt voices understanding.

## 2015-08-27 ENCOUNTER — Ambulatory Visit: Payer: Medicaid Other | Admitting: Anesthesiology

## 2015-08-27 ENCOUNTER — Ambulatory Visit
Admission: RE | Admit: 2015-08-27 | Discharge: 2015-08-27 | Disposition: A | Discharge status: Home / Self Care | Payer: Medicaid Other | Source: Ambulatory Visit | Attending: Urology | Admitting: Urology

## 2015-08-27 ENCOUNTER — Encounter: Payer: Self-pay | Admitting: *Deleted

## 2015-08-27 ENCOUNTER — Encounter
Admission: RE | Disposition: A | Discharge status: Home / Self Care | Payer: Self-pay | Source: Ambulatory Visit | Attending: Urology

## 2015-08-27 DIAGNOSIS — R300 Dysuria: Secondary | ICD-10-CM | POA: Insufficient documentation

## 2015-08-27 DIAGNOSIS — Z87891 Personal history of nicotine dependence: Secondary | ICD-10-CM | POA: Insufficient documentation

## 2015-08-27 DIAGNOSIS — J45990 Exercise induced bronchospasm: Secondary | ICD-10-CM | POA: Insufficient documentation

## 2015-08-27 DIAGNOSIS — E669 Obesity, unspecified: Secondary | ICD-10-CM | POA: Diagnosis not present

## 2015-08-27 DIAGNOSIS — Z87440 Personal history of urinary (tract) infections: Secondary | ICD-10-CM | POA: Insufficient documentation

## 2015-08-27 DIAGNOSIS — Z85828 Personal history of other malignant neoplasm of skin: Secondary | ICD-10-CM | POA: Insufficient documentation

## 2015-08-27 DIAGNOSIS — Z888 Allergy status to other drugs, medicaments and biological substances status: Secondary | ICD-10-CM | POA: Insufficient documentation

## 2015-08-27 DIAGNOSIS — Z90710 Acquired absence of both cervix and uterus: Secondary | ICD-10-CM | POA: Diagnosis not present

## 2015-08-27 DIAGNOSIS — Z8042 Family history of malignant neoplasm of prostate: Secondary | ICD-10-CM | POA: Diagnosis not present

## 2015-08-27 DIAGNOSIS — K589 Irritable bowel syndrome without diarrhea: Secondary | ICD-10-CM | POA: Insufficient documentation

## 2015-08-27 DIAGNOSIS — Z808 Family history of malignant neoplasm of other organs or systems: Secondary | ICD-10-CM | POA: Insufficient documentation

## 2015-08-27 DIAGNOSIS — Z880 Allergy status to penicillin: Secondary | ICD-10-CM | POA: Diagnosis not present

## 2015-08-27 DIAGNOSIS — Z825 Family history of asthma and other chronic lower respiratory diseases: Secondary | ICD-10-CM | POA: Diagnosis not present

## 2015-08-27 DIAGNOSIS — Z91013 Allergy to seafood: Secondary | ICD-10-CM | POA: Insufficient documentation

## 2015-08-27 DIAGNOSIS — Z6830 Body mass index (BMI) 30.0-30.9, adult: Secondary | ICD-10-CM | POA: Insufficient documentation

## 2015-08-27 DIAGNOSIS — Z801 Family history of malignant neoplasm of trachea, bronchus and lung: Secondary | ICD-10-CM | POA: Insufficient documentation

## 2015-08-27 DIAGNOSIS — Z9013 Acquired absence of bilateral breasts and nipples: Secondary | ICD-10-CM | POA: Insufficient documentation

## 2015-08-27 DIAGNOSIS — Z853 Personal history of malignant neoplasm of breast: Secondary | ICD-10-CM | POA: Insufficient documentation

## 2015-08-27 DIAGNOSIS — R3129 Other microscopic hematuria: Secondary | ICD-10-CM | POA: Diagnosis present

## 2015-08-27 DIAGNOSIS — Z8049 Family history of malignant neoplasm of other genital organs: Secondary | ICD-10-CM | POA: Diagnosis not present

## 2015-08-27 DIAGNOSIS — Z79899 Other long term (current) drug therapy: Secondary | ICD-10-CM | POA: Insufficient documentation

## 2015-08-27 DIAGNOSIS — Z171 Estrogen receptor negative status [ER-]: Secondary | ICD-10-CM | POA: Insufficient documentation

## 2015-08-27 DIAGNOSIS — Z823 Family history of stroke: Secondary | ICD-10-CM | POA: Diagnosis not present

## 2015-08-27 DIAGNOSIS — N3011 Interstitial cystitis (chronic) with hematuria: Secondary | ICD-10-CM | POA: Diagnosis not present

## 2015-08-27 DIAGNOSIS — M797 Fibromyalgia: Secondary | ICD-10-CM | POA: Diagnosis not present

## 2015-08-27 DIAGNOSIS — F4310 Post-traumatic stress disorder, unspecified: Secondary | ICD-10-CM | POA: Insufficient documentation

## 2015-08-27 DIAGNOSIS — Z820 Family history of epilepsy and other diseases of the nervous system: Secondary | ICD-10-CM | POA: Diagnosis not present

## 2015-08-27 DIAGNOSIS — K219 Gastro-esophageal reflux disease without esophagitis: Secondary | ICD-10-CM | POA: Insufficient documentation

## 2015-08-27 DIAGNOSIS — Z8051 Family history of malignant neoplasm of kidney: Secondary | ICD-10-CM | POA: Insufficient documentation

## 2015-08-27 DIAGNOSIS — F3181 Bipolar II disorder: Secondary | ICD-10-CM | POA: Insufficient documentation

## 2015-08-27 DIAGNOSIS — Z885 Allergy status to narcotic agent status: Secondary | ICD-10-CM | POA: Insufficient documentation

## 2015-08-27 DIAGNOSIS — Z886 Allergy status to analgesic agent status: Secondary | ICD-10-CM | POA: Insufficient documentation

## 2015-08-27 DIAGNOSIS — F329 Major depressive disorder, single episode, unspecified: Secondary | ICD-10-CM | POA: Insufficient documentation

## 2015-08-27 HISTORY — PX: CYSTOSCOPY W/ RETROGRADES: SHX1426

## 2015-08-27 HISTORY — DX: Personal history of Methicillin resistant Staphylococcus aureus infection: Z86.14

## 2015-08-27 HISTORY — PX: CYSTO WITH HYDRODISTENSION: SHX5453

## 2015-08-27 SURGERY — CYSTOSCOPY, WITH RETROGRADE PYELOGRAM
Anesthesia: General

## 2015-08-27 MED ORDER — LACTATED RINGERS IV SOLN
INTRAVENOUS | Status: DC
  Administered 2015-08-27 (×2): via INTRAVENOUS

## 2015-08-27 MED ORDER — FENTANYL CITRATE (PF) 100 MCG/2ML IJ SOLN
25.0000 ug | INTRAMUSCULAR | Status: DC | PRN
  Administered 2015-08-27 (×4): 25 ug via INTRAVENOUS

## 2015-08-27 MED ORDER — PROPOFOL 10 MG/ML IV BOLUS
INTRAVENOUS | Status: DC | PRN
  Administered 2015-08-27: 180 mg via INTRAVENOUS

## 2015-08-27 MED ORDER — HYDROMORPHONE HCL 1 MG/ML IJ SOLN
INTRAMUSCULAR | Status: AC
  Administered 2015-08-27: 0.5 mg via INTRAVENOUS
  Filled 2015-08-27: qty 1

## 2015-08-27 MED ORDER — BELLADONNA ALKALOIDS-OPIUM 16.2-60 MG RE SUPP
RECTAL | Status: DC
Start: 2015-08-27 — End: 2015-08-27
  Filled 2015-08-27: qty 1

## 2015-08-27 MED ORDER — FENTANYL CITRATE (PF) 100 MCG/2ML IJ SOLN
INTRAMUSCULAR | Status: AC
  Administered 2015-08-27: 25 ug via INTRAVENOUS
  Filled 2015-08-27: qty 2

## 2015-08-27 MED ORDER — CEFAZOLIN SODIUM-DEXTROSE 2-3 GM-% IV SOLR
INTRAVENOUS | Status: AC
  Administered 2015-08-27: 2 g via INTRAVENOUS
  Filled 2015-08-27: qty 50

## 2015-08-27 MED ORDER — CEFAZOLIN SODIUM-DEXTROSE 2-3 GM-% IV SOLR
2.0000 g | Freq: Once | INTRAVENOUS | Status: AC
  Administered 2015-08-27: 2 g via INTRAVENOUS

## 2015-08-27 MED ORDER — IOTHALAMATE MEGLUMINE 43 % IV SOLN
INTRAVENOUS | Status: DC | PRN
  Administered 2015-08-27: 20 mL

## 2015-08-27 MED ORDER — BELLADONNA ALKALOIDS-OPIUM 16.2-60 MG RE SUPP
1.0000 | Freq: Once | RECTAL | Status: AC
  Administered 2015-08-27: 1 via RECTAL

## 2015-08-27 MED ORDER — SUCCINYLCHOLINE CHLORIDE 20 MG/ML IJ SOLN
INTRAMUSCULAR | Status: DC | PRN
  Administered 2015-08-27: 100 mg via INTRAVENOUS

## 2015-08-27 MED ORDER — MIDAZOLAM HCL 2 MG/2ML IJ SOLN
INTRAMUSCULAR | Status: DC | PRN
  Administered 2015-08-27: 2 mg via INTRAVENOUS

## 2015-08-27 MED ORDER — ONDANSETRON HCL 4 MG/2ML IJ SOLN: 4.0000 mg | Freq: Once | INTRAMUSCULAR | Status: DC | PRN

## 2015-08-27 MED ORDER — FENTANYL CITRATE (PF) 100 MCG/2ML IJ SOLN
INTRAMUSCULAR | Status: DC | PRN
  Administered 2015-08-27: 50 ug via INTRAVENOUS

## 2015-08-27 MED ORDER — HYDROMORPHONE HCL 1 MG/ML IJ SOLN
0.2500 mg | INTRAMUSCULAR | Status: DC | PRN
  Administered 2015-08-27 (×3): 0.5 mg via INTRAVENOUS

## 2015-08-27 SURGICAL SUPPLY — 18 items
BAG DRAIN CYSTO-URO LG1000N (MISCELLANEOUS) ×6 IMPLANT
CATH URETL 5X70 OPEN END (CATHETERS) ×6 IMPLANT
CONRAY 43 FOR UROLOGY 50M (MISCELLANEOUS) ×3 IMPLANT
GLOVE BIO SURGEON STRL SZ 6.5 (GLOVE) ×6 IMPLANT
GLOVE BIO SURGEON STRL SZ7 (GLOVE) ×12 IMPLANT
GOWN STRL REUS W/ TWL LRG LVL3 (GOWN DISPOSABLE) ×8 IMPLANT
GOWN STRL REUS W/TWL LRG LVL3 (GOWN DISPOSABLE) ×4
KIT RM TURNOVER CYSTO AR (KITS) ×3 IMPLANT
PACK CYSTO AR (MISCELLANEOUS) ×6 IMPLANT
PREP PVP WINGED SPONGE (MISCELLANEOUS) ×6 IMPLANT
PUMP SINGLE ACTION SAP (PUMP) IMPLANT
SENSORWIRE 0.038 NOT ANGLED (WIRE) ×9
SET CYSTO W/LG BORE CLAMP LF (SET/KITS/TRAYS/PACK) ×6 IMPLANT
SOL .9 NS 3000ML IRR  AL (IV SOLUTION) ×2
SOL .9 NS 3000ML IRR UROMATIC (IV SOLUTION) ×4 IMPLANT
SURGILUBE 2OZ TUBE FLIPTOP (MISCELLANEOUS) ×6 IMPLANT
WATER STERILE IRR 1000ML POUR (IV SOLUTION) ×3 IMPLANT
WIRE SENSOR 0.038 NOT ANGLED (WIRE) ×6 IMPLANT

## 2015-08-27 NOTE — Anesthesia Postprocedure Evaluation (Signed)
Anesthesia Post Note  Patient: [name]  Procedure(s) Performed: Procedure(s) (LRB): CYSTOSCOPY WITH RETROGRADE PYELOGRAM (Bilateral) CYSTOSCOPY/HYDRODISTENSION (N/A)  Patient location during evaluation: PACU Anesthesia Type: General Level of consciousness: awake and alert Pain management: pain level controlled Vital Signs Assessment: post-procedure vital signs reviewed and stable Respiratory status: spontaneous breathing and respiratory function stable Cardiovascular status: stable Anesthetic complications: no    Last Vitals:  Filed Vitals:   08/27/15 1041 08/27/15 1230  BP: 118/76   Pulse: 99 102  Temp: 36.7 C 36.3 C  Resp: 16 11    Last Pain: There were no vitals filed for this visit.               KEPHART,WILLIAM K

## 2015-08-27 NOTE — Progress Notes (Signed)
After fentanyl 133mcg and 1mg  of hydromorphone  Pt still having bladder spasms and sat going down to 92
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
## 2015-08-27 NOTE — Discharge Instructions (Signed)
Hydrodistention of the Bladder Hydrodistention is a procedure to examine your bladder. During hydrodistention, your bladder is filled with fluid until it is more full than normal (distended). Your caregiver will use a thin tube with a camera on the end to examine your urethra and bladder (cystoscope). LET YOUR CAREGIVER KNOW ABOUT:  Any allergies you have.  All medicines you are taking, including vitamins, herbs, eyedrops, and over-the-counter medicines and creams.  Previous problems you or members of your family have had with the use of anesthetics.  Any blood disorders you have.  Other health problems you have. RISKS AND COMPLICATIONS Generally, hydrodistention is a safe procedure. However, as with any surgical procedure, complications can occur. Possible complications associated with hydrodistention include:  Bleeding.  Infection.  Bladder puncture.  Difficulty urinating.  Temporary inability to urinate. If this happens you may need a tube to drain urine from your bladder outside of your body (urinary catheter) for a short period after your procedure. BEFORE THE PROCEDURE  Do not eat or drink anything for at least 6 hours before your procedure.  Make plans for someone to drive you home after the procedure. PROCEDURE Hydrodistention of the bladder is an outpatient procedure. You will not need to stay overnight. It may be done in a hospital or in a surgery clinic. It usually takes about 30 minutes.  Small monitors will be placed on your body. They are used to check your heart rate, blood pressure level, and oxygen level during the procedure. An intravenous (IV) tube will be inserted into one of your veins. Fluids and medicine will flow directly into your body through the IV tube. You may be given medicine to make you sleep (general anesthetic) or you may be given medicine that makes you numb from the waist down (spinal anesthesia). Your caregiver will gently glide the cystoscope  into the tube through which urine flows out of your body (urethra) all the way to your bladder. Once in your bladder, fluid will flow through the cystoscope until your bladder is full. Your caregiver will measure the amount of fluid it takes to fill your bladder. If your caregiver notices any abnormal tissue in your bladder, a tissue sample will be removed and sent to a lab to be examined (biopsy). When your caregiver has finished the exam, the fluid will be drained from your bladder and the cystoscope will be removed. AFTER THE PROCEDURE You will stay in a recovery area until the anesthetic wears off. Your pulse and blood pressure will be frequently monitored until you are stable. Then you can go home.   This information is not intended to replace advice given to you by your health care provider. Make sure you discuss any questions you have with your health care provider.       AMBULATORY SURGERY  DISCHARGE INSTRUCTIONS   1) The drugs that you were given will stay in your system until tomorrow so for the next 24 hours you should not:  A) Drive an automobile B) Make any legal decisions C) Drink any alcoholic beverage   2) You may resume regular meals tomorrow.  Today it is better to start with liquids and gradually work up to solid foods.  You may eat anything you prefer, but it is better to start with liquids, then soup and crackers, and gradually work up to solid foods.   3) Please notify your doctor immediately if you have any unusual bleeding, trouble breathing, redness and pain at the surgery site, drainage,  fever, or pain not relieved by medication. 4)   5) Your post-operative visit with Dr.                                     is: Date:                        Time:    Please call to schedule your post-operative visit.  6) Additional Instructions:

## 2015-08-27 NOTE — Anesthesia Preprocedure Evaluation (Signed)
Anesthesia Evaluation  Patient identified by MRN, date of birth, ID band Patient awake    Reviewed: Allergy & Precautions, NPO status , Patient's Chart, lab work & pertinent test results  History of Anesthesia Complications Negative for: history of anesthetic complications  Airway Mallampati: II       Dental  (+) Teeth Intact   Pulmonary asthma , former smoker,           Cardiovascular negative cardio ROS       Neuro/Psych Anxiety Depression Bipolar Disorder negative neurological ROS     GI/Hepatic Neg liver ROS, GERD  Medicated and Poorly Controlled,  Endo/Other  negative endocrine ROS  Renal/GU negative Renal ROS     Musculoskeletal  (+) Fibromyalgia -  Abdominal   Peds  Hematology   Anesthesia Other Findings   Reproductive/Obstetrics                             Anesthesia Physical Anesthesia Plan  ASA: II  Anesthesia Plan: General   Post-op Pain Management:    Induction: Intravenous  Airway Management Planned: Oral ETT  Additional Equipment:   Intra-op Plan:   Post-operative Plan:   Informed Consent: I have reviewed the patients History and Physical, chart, labs and discussed the procedure including the risks, benefits and alternatives for the proposed anesthesia with the patient or authorized representative who has indicated his/her understanding and acceptance.     Plan Discussed with:   Anesthesia Plan Comments:         Anesthesia Quick Evaluation

## 2015-08-27 NOTE — Op Note (Signed)
Date of procedure: 08/27/2015  Preoperative diagnosis:  1. Microscopic hematuria 2. Interstitial cystitis   Postoperative diagnosis:  1. Same as above  Procedure: 1. Cystoscopy 2. Bilateral retrograde program 3. Hydrodistention of the bladder  Surgeon: [name], MD  Anesthesia: General  Complications: None  Intraoperative findings: Normal bilateral retrogrades.  Cystoscopy with very mild trigonitis. 600 cc bladder capacity.  EBL: Minimal  Specimens: None  Drains: None  Indication: [name] is a 45 y.o. patient with recurrent cystitis, microscopic hematuria, and history of interstitial cystitis.  After reviewing the management options for treatment, she elected to proceed with the above surgical procedure(s). We have discussed the potential benefits and risks of the procedure, side effects of the proposed treatment, the likelihood of the patient achieving the goals of the procedure, and any potential problems that might occur during the procedure or recuperation. Informed consent has been obtained.  Description of procedure:  The patient was taken to the operating room and general anesthesia was induced.  The patient was placed in the dorsal lithotomy position, prepped and draped in the usual sterile fashion, and preoperative antibiotics were administered. A preoperative time-out was performed.   A rigid 21 French cystoscope was advanced per urethra into the bladder. The bladder was carefully inspected which revealed no evidence of ulcerations, masses, lesions, or stones. There is some very mild trigonitis but no other areas of inflammation. The trigone was normal with orthotopic utilized. There is clear effluxed from both. Attention was then turned to the left ureteral orifice which is continuing using a 5 [place] open-ended ureteral catheter. Gentle retrograde pyelogram was then performed revealing a normal caliber ureter without filling defects and a normal upper  tract collecting system. There is no evidence of hydronephrosis or filling defects. The same exact procedure was performed on the right side which again with normal without hydronephrosis or filling defects. The ureter also appeared to be pathology on this side as well. These images were saved to PACS.  Next, the hydrodistention was performed with the capacity of 600 cc prior to leakage around the scope. At this to remain distended for a total of 5 minutes prior to draining the bladder. Upon bladder refilling, there were a few small glomerulations noted that no significant other findings are hematuria. A second five-minute distention was then performed again with a total capacity of 600 cc. The bladder was then drained. The patient was repositioned the supine position after being clean and dry, or reverse of anesthesia, taken to PACU in stable condition.  [name], M.D.

## 2015-08-27 NOTE — Transfer of Care (Signed)
Immediate Anesthesia Transfer of Care Note  Patient: [name]  Procedure(s) Performed: Procedure(s): CYSTOSCOPY WITH RETROGRADE PYELOGRAM (Bilateral) CYSTOSCOPY/HYDRODISTENSION (N/A)  Patient Location: PACU  Anesthesia Type:General  Level of Consciousness: sedated  Airway & Oxygen Therapy: Patient Spontanous Breathing  Post-op Assessment: Report given to RN  Post vital signs: stable  Last Vitals:  Filed Vitals:   08/27/15 1041  BP: 118/76  Pulse: 99  Temp: 36.7 C  Resp: 16    Complications: No apparent anesthesia complications

## 2015-09-16 ENCOUNTER — Encounter: Payer: Self-pay | Admitting: Hematology and Oncology

## 2015-09-24 ENCOUNTER — Ambulatory Visit: Payer: Medicaid Other | Admitting: Urology

## 2015-09-27 ENCOUNTER — Other Ambulatory Visit: Payer: Self-pay

## 2015-09-27 DIAGNOSIS — C50911 Malignant neoplasm of unspecified site of right female breast: Secondary | ICD-10-CM

## 2015-09-30 ENCOUNTER — Inpatient Hospital Stay: Payer: Medicaid Other

## 2015-09-30 ENCOUNTER — Other Ambulatory Visit: Payer: Self-pay

## 2015-09-30 ENCOUNTER — Inpatient Hospital Stay: Payer: Medicaid Other | Attending: Hematology and Oncology | Admitting: Hematology and Oncology

## 2015-09-30 ENCOUNTER — Encounter: Payer: Self-pay | Admitting: Hematology and Oncology

## 2015-09-30 VITALS — BP 117/82 | HR 102 | Temp 97.1°F | Resp 18 | Ht 63.0 in | Wt 175.3 lb

## 2015-09-30 DIAGNOSIS — K219 Gastro-esophageal reflux disease without esophagitis: Secondary | ICD-10-CM | POA: Diagnosis not present

## 2015-09-30 DIAGNOSIS — Z85828 Personal history of other malignant neoplasm of skin: Secondary | ICD-10-CM | POA: Diagnosis not present

## 2015-09-30 DIAGNOSIS — C50911 Malignant neoplasm of unspecified site of right female breast: Secondary | ICD-10-CM

## 2015-09-30 DIAGNOSIS — Z801 Family history of malignant neoplasm of trachea, bronchus and lung: Secondary | ICD-10-CM | POA: Insufficient documentation

## 2015-09-30 DIAGNOSIS — M5136 Other intervertebral disc degeneration, lumbar region: Secondary | ICD-10-CM

## 2015-09-30 DIAGNOSIS — Z86000 Personal history of in-situ neoplasm of breast: Secondary | ICD-10-CM

## 2015-09-30 DIAGNOSIS — Z79899 Other long term (current) drug therapy: Secondary | ICD-10-CM | POA: Diagnosis not present

## 2015-09-30 DIAGNOSIS — M797 Fibromyalgia: Secondary | ICD-10-CM

## 2015-09-30 DIAGNOSIS — K589 Irritable bowel syndrome without diarrhea: Secondary | ICD-10-CM | POA: Diagnosis not present

## 2015-09-30 DIAGNOSIS — Z87891 Personal history of nicotine dependence: Secondary | ICD-10-CM | POA: Diagnosis not present

## 2015-09-30 DIAGNOSIS — Z86010 Personal history of colonic polyps: Secondary | ICD-10-CM | POA: Insufficient documentation

## 2015-09-30 DIAGNOSIS — Z8051 Family history of malignant neoplasm of kidney: Secondary | ICD-10-CM | POA: Insufficient documentation

## 2015-09-30 DIAGNOSIS — Z9013 Acquired absence of bilateral breasts and nipples: Secondary | ICD-10-CM | POA: Diagnosis not present

## 2015-09-30 DIAGNOSIS — Z923 Personal history of irradiation: Secondary | ICD-10-CM | POA: Diagnosis not present

## 2015-09-30 DIAGNOSIS — D649 Anemia, unspecified: Secondary | ICD-10-CM

## 2015-09-30 DIAGNOSIS — Z8042 Family history of malignant neoplasm of prostate: Secondary | ICD-10-CM | POA: Diagnosis not present

## 2015-09-30 DIAGNOSIS — J45909 Unspecified asthma, uncomplicated: Secondary | ICD-10-CM | POA: Diagnosis not present

## 2015-09-30 DIAGNOSIS — Z171 Estrogen receptor negative status [ER-]: Secondary | ICD-10-CM | POA: Diagnosis not present

## 2015-09-30 LAB — COMPREHENSIVE METABOLIC PANEL
ALT: 54 U/L (ref 14–54)
AST: 38 U/L (ref 15–41)
Albumin: 4.7 g/dL (ref 3.5–5.0)
Alkaline Phosphatase: 94 U/L (ref 38–126)
Anion gap: 5 (ref 5–15)
BUN: 9 mg/dL (ref 6–20)
CO2: 31 mmol/L (ref 22–32)
Calcium: 9.4 mg/dL (ref 8.9–10.3)
Chloride: 102 mmol/L (ref 101–111)
Creatinine, Ser: 0.71 mg/dL (ref 0.44–1.00)
GFR calc Af Amer: >60 mL/min (ref >60)
GFR calc non Af Amer: >60 mL/min (ref >60)
Glucose, Bld: 103 mg/dL — ABNORMAL HIGH (ref 65–99)
Potassium: 3.9 mmol/L (ref 3.5–5.1)
Sodium: 138 mmol/L (ref 135–145)
Total Bilirubin: 0.7 mg/dL (ref 0.3–1.2)
Total Protein: 7.8 g/dL (ref 6.5–8.1)

## 2015-09-30 LAB — CBC WITH DIFFERENTIAL/PLATELET
Basophils Absolute: 0 10*3/uL (ref 0–0.1)
Basophils Relative: 0 %
Eosinophils Absolute: 0.3 10*3/uL (ref 0–0.7)
Eosinophils Relative: 3 %
HCT: 40.2 % (ref 35.0–47.0)
Hemoglobin: 14.1 g/dL (ref 12.0–16.0)
Lymphocytes Relative: 18 %
Lymphs Abs: 1.7 10*3/uL (ref 1.0–3.6)
MCH: 32.4 pg (ref 26.0–34.0)
MCHC: 35.1 g/dL (ref 32.0–36.0)
MCV: 92.2 fL (ref 80.0–100.0)
Monocytes Absolute: 0.6 10*3/uL (ref 0.2–0.9)
Monocytes Relative: 6 %
Neutro Abs: 6.9 10*3/uL — ABNORMAL HIGH (ref 1.4–6.5)
Neutrophils Relative %: 73 %
Platelets: 299 10*3/uL (ref 150–440)
RBC: 4.36 MIL/uL (ref 3.80–5.20)
RDW: 14 % (ref 11.5–14.5)
WBC: 9.6 10*3/uL (ref 3.6–11.0)

## 2015-09-30 NOTE — Progress Notes (Signed)
[facility] day:  09/30/2015   Chief Complaint: [name] is a 45 y.o. 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   HPI: The patient was last seen in the medical oncology clinic on 05/30/2015.  At that time, she was seen for new patient assessment.  Symptomatically, she had fibromyalgia.  She was fatigued.  She had shortness of breath due to asthma.  She denied any breast concerns.  Exam was unremarkable.  At last visit, we discussed her pathology with high grade DCIS and small area of invasive Her2/neu + disease.  There was no plan for hormonal therapy as DCIS was ER/PR negative.  We discussed plan for ongoing surveillance.  Labs were requested, but not done secondary to insurance issues.  She also noted a 3 months history of anemia.  She ate little meat.  EGD and colonoscopy on 11/01/2013 revealed gastritis and duodenitis.  There was a sigmoid polyp.  Pathology was benign.  She denies any melena or hematochezia.    During the interim, she has done well.  She denies any breast concerns.  She notes ongoing tenderness in her back on the right side s/p latissimus dorsi myocutaneous flap reconstructive surgery.  She has ongoing issues with fibromyalgia and irritable bowel.  Regarding her anemia, she states that she is doing better.   She continues to take an iron pill (Fusion 6).  She denies any melena or hematochezia.  She denies any hematuria or vaginal bleeding (s/p hysterectomy).  Diet is better.  She states that she is trying to eat healthy.  She describes eating chicken, salad, fish, and crock pot meals.  Past Medical History  Diagnosis Date  . Herniated disc     Degenerative, Lumbar disc  . Asthma     exercise induced  . Fibromyalgia   . Fracture 1993    Left Foot  . GERD (gastroesophageal reflux disease)     hiatal hernia  . Whiplash 2005    secondary to rear end  collison  . Panic attack 2008    hospitalized  . IBS (irritable bowel syndrome) 2008    stress induced constipation/diarrhea  . Major depressive disorder ([place]) 2001, 1996    was out of work for 3 and 6 months  . Cystitis     recurrent, precipitated by sexual intercourse  . Migraine     tension  . Bowel trouble 1994    IBS  . PMDD (premenstrual dysphoric disorder)   . Bronchitis 2000  . Bipolar 2 disorder ([place]) 2012  . Panic attack 2012  . IBS (irritable bowel syndrome)   . Basal cell carcinoma 2007    Left Breast  . Breast cancer ([place]) 2013    DCIS ER/PR negative.  BRCA testing February 2013 negative.  . Squamous cell carcinoma ([place]) 2008  . ASTHMA, EXERCISE INDUCED 05/25/2007    Qualifier: Diagnosis of  By: [name], [facility]   . GERD 05/25/2007    Qualifier: Diagnosis of  By: [name], [facility]   . Irritable bowel syndrome 05/25/2007    Qualifier: Diagnosis of  By: [name], [facility]   . FIBROMYALGIA 05/25/2007    Referred by Pain Mgmt Primus Bravo) to [place],  Estanislado Emms for  2nd opinion on diagnosis. FM diagnosis supported.  She advocated continued use of tylenol #4 and flexeril  Only because of patient's failure  to tolerate non narcotics  including cymbalta and elavil. Recommended use of tramadol and gabapentin with caution  Narcotics are are generally avoided in treatment of FM due to potential increase in centralized pain  Recommended working with a physical therapist or group setting therapy such as that offered by the [place] at the [facility] or water aerobics classes.    Marland Kitchen Post traumatic stress disorder 2011/06/10    Triggered by the death of her father in 10/22/2008, with subsequent divorce and loss of family suppport system and lack of psychiatric care contributing to prolonged recovery.    . Panic disorder 07/01/2011  . 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 Obesity, unspecified 02/06/2013  . DCIS (ductal carcinoma in situ)  of breast 11/02/2013  . Absence of breast 01/29/2015  . Malignant neoplasm of breast ([place]) 12/20/2013  . Hx MRSA infection     Past Surgical History  Procedure Laterality Date  . Cesarean section  10/22/05    emergency  . Laparoscopic endometriosis fulguration  202    [name]  . Wisdom tooth extraction  1991    x 4  . Colonoscopy  2006/10/22    [place]  . Breast lumpectomy  2011-10-23    right breast ,DCIS, ER/PR negative   . Vaginal hysterectomy  2011/10/23  . Cystoscopy  October 23, 2011  . Mastectomy Bilateral   . Nasal polyp surgery    . Nasal sinus surgery    . Cystoscopy w/ retrogrades Bilateral 08/27/2015    Procedure: CYSTOSCOPY WITH RETROGRADE PYELOGRAM;  Surgeon: [name], MD;  Location: [facility] ORS;  Service: Urology;  Laterality: Bilateral;  . Cysto with hydrodistension N/A 08/27/2015    Procedure: CYSTOSCOPY/HYDRODISTENSION;  Surgeon: [name], MD;  Location: [facility] ORS;  Service: Urology;  Laterality: N/A;    Family History  Problem Relation Age of Onset  . COPD Mother   . Fibrocystic breast disease Mother   . Cervical cancer Mother   . Kidney cancer Paternal Aunt   . Cancer Maternal Grandfather     lung  . Cancer Father     Brain cancer died in Oct 22, 2008  . Emphysema Mother   . Chronic bronchitis Mother   . COPD Paternal Grandfather     Deceased  . Lung cancer Paternal Grandfather   . Cancer Paternal Grandfather     melanoma  . Stroke Maternal Grandmother     Deceased  . Hepatitis C Paternal Aunt     Deceased 2008-10-22  . Alzheimer's disease Paternal Grandfather     Deceased Oct 22, 2008  . Prostate cancer Father   . Prostate cancer Paternal Grandfather     Social History:  reports that she quit smoking about 17 years ago. She has never used smokeless tobacco. She reports that she drinks alcohol. She reports that she does not use illicit drugs.  She is alone today.  Allergies:  Allergies  Allergen Reactions  . Fluoxetine Hives  . Aspirin   . Percocet [Oxycodone-Acetaminophen]   .  Ibuprofen Rash and Other (See Comments)    THROAT SPASMS  . Shrimp [Shellfish Allergy] Swelling and Rash    Current Medications: Current Outpatient Prescriptions  Medication Sig Dispense Refill  . albuterol (PROVENTIL,VENTOLIN) 90 MCG/ACT inhaler Inhale 2 puffs into the lungs 4 (four) times daily as needed.     . Beclomethasone Dipropionate (QNASL) 80 MCG/ACT AERS Place into the nose.    . busPIRone (BUSPAR) 5 MG tablet Take 5 mg by mouth 2 (two)  times daily.    . cetirizine (ZYRTEC) 10 MG tablet Take 10 mg by mouth daily.    . clonazePAM (KLONOPIN) 0.5 MG tablet Take 0.5 mg by mouth at bedtime.     . cyclobenzaprine (FLEXERIL) 10 MG tablet TAKE 1 TABLET BY MOUTH TWICE A DAY AS NEEDED FOR MUSCLE SPASM 60 tablet 5  . FLOVENT HFA 110 MCG/ACT inhaler USE 2 PUFFS TWICE A DAY-PRN  5  . Iron-FA-B Cmp-C-Biot-Probiotic (FUSION PLUS) CAPS Take 1 capsule by mouth daily.  6  . lubiprostone (AMITIZA) 24 MCG capsule Take 24 mcg by mouth daily with breakfast.    . omeprazole (PRILOSEC) 40 MG capsule TAKE ONE CAPSULE BY MOUTH TWICE A DAY (Patient taking differently: TAKE ONE CAPSULE BY MOUTH QD-AM) 60 capsule 11  . oxyCODONE (OXY IR/ROXICODONE) 5 MG immediate release tablet Take 5 mg by mouth every 8 (eight) hours.  0  . polyethylene glycol (MIRALAX / GLYCOLAX) packet Take 17 g by mouth as needed.     . sertraline (ZOLOFT) 100 MG tablet Take 200 mg by mouth at bedtime.    . simvastatin (ZOCOR) 20 MG tablet Take 20 mg by mouth daily at 6 PM.      No current facility-administered medications for this visit.    Review of Systems:  GENERAL:  Feels "ok".  No fevers, sweats or weight loss. PERFORMANCE STATUS (ECOG):  1 HEENT:  No visual changes, runny nose, sore throat, mouth sores or tenderness. Lungs:  Asthma.  No shortness of breath or cough.  No hemoptysis. Cardiac:  No chest pain, palpitations, orthopnea, or PND. GI:  Irritable bowel (constipation/diarrhea).  No nausea, vomiting, melena or  hematochezia. GU:  No urgency, frequency, dysuria, or hematuria. Musculoskeletal:  Fibromyalgia (hands, shoulders, knees, back, hip).  No muscle tenderness. Extremities:  No pain or swelling. Skin:  No rashes or skin changes. Neuro:  No headache, numbness or weakness, balance or coordination issues. Endocrine:  No diabetes, thyroid issues, hot flashes or night sweats. Psych:  No mood changes, depression or anxiety.  Irritable. Pain:  No focal pain. Review of systems:  All other systems reviewed and found to be negative.  Physical Exam: Blood pressure 117/82, pulse 102, temperature 97.1 F (36.2 C), temperature source Tympanic, resp. rate 18, height 5' 3"  (1.6 m), weight 175 lb 4.3 oz (79.5 kg), last menstrual period 09/14/2011. GENERAL:  Well developed, well nourished, sitting comfortably in the exam room in no acute distress. MENTAL STATUS:  Alert and oriented to person, place and time. HEAD:  Long brown hair.  Normocephalic, atraumatic, face symmetric, no Cushingoid features. EYES:  Glass.  Blue eyes.  Pupils equal round and reactive to light and accomodation.  No conjunctivitis or scleral icterus. ENT:  Oropharynx clear without lesion.  Tongue normal. Mucous membranes moist.  RESPIRATORY:  Clear to auscultation without rales, wheezes or rhonchi. CARDIOVASCULAR:  Regular rate and rhythm without murmur, rub or gallop. CHEST WALL:  Well healed posterior incision s/p latissimus dorsi myocutaneous flap transfer. BREAST:  s/p bilateral breast reconstruction.  Right side latissimus dorsi myocutaneous flap with implant.  Left side with implant.  Right side slightly tender.   Right side warmer than left side.  No masses or skin changes.  ABDOMEN:  Soft, non-tender, with active bowel sounds, and no hepatosplenomegaly.  No masses. SKIN:  No rashes, ulcers or lesions. EXTREMITIES: No edema, no skin discoloration or tenderness.  No palpable cords. LYMPH NODES: No palpable cervical, supraclavicular,  axillary or inguinal adenopathy  NEUROLOGICAL:  Unremarkable. PSYCH:  Appropriate.  No visits with results within 3 Day(s) from this visit. Latest known visit with results is:  [facility] Conversion on 10/30/2013  Component Date Value Ref Range Status  . Patholgy report 10/31/2013    Final                   Value:========== TEST NAME ==========  ========= RESULTS =========  = REFERENCE RANGE =  PATHOLOGY REPORT Pathology Report .                               [   Final Report         ]                   Material submitted:                                        . RIGHT BREAST BIOPSY .                               [   Final Report         ]                   Pre-operative diagnosis:                                        . RIGHT BREAST CALCS UPPER OUTER QUADRANT .                               [   Final Report         ]                    Diagnosis: RIGHT BREAST, STEREOTACTIC CORE BIOPSY: - DUCTAL CARCINOMA IN SITU (DCIS), HIGH GRADE, ASSOCIATED WITH CALCIFICATIONS. Marland Kitchen NOTE: Blocks A1-A4 contain multiple core fragments involved by high grade DCIS with extensive comedo-type necrosis and calcification; some ducts contain only residual calcification. In this sample DCIS spans up to 5 mm but the number of invol                         ved cores and the mammographic findings suggest a considerably greater lesion diameter. The histologic features appear to correlate with the specimen radiograph. There are several displaced calcifications, including one noted in block A5. Some ducts are severely distorted by periductal fibrosis with entrapped areas of DCIS. No definite invasion is identified in this specimen. . Because so few of the ducts contain viable tumor cells, estrogen and progesterone receptor testing is deferred at this time. . I note the previous history of DCIS treated by lumpectomy and radiation in 2013. The most recent core biopsy of the right breast at 9:00([id number],  10/13/13) was reviewed; it shows features of post-surgical scarring and radiation effect. Clinical correlation is recommended with regard to the biopsy locations. . Intradepartmental case review was performed. The diagnosis was called to [facility] in Dr. Dwyane Luo office at 11:20 AM. Read-back was performed. MSO/04/01/2  Abir.March  .                               [   Final Report         ]                   Electronically signed:                                     Marland Kitchen Vivia Ewing, MD, Pathologist .                               [   Final Report         ]                   Gross description:                                         . Received in a formalin filled core container labeled Pat Kocher and stereotactic breast biopsy right calcs are multiple yellow tan fatty core fragments ranging from 0.3 to 1.5 cm in length and each with a diameter of 0.3 cm. The specimen is inked blue and is entirely submitted: A1 - 12:00 quadrant; A2 - 3:00 quadrant; A3 - 6:00 quadrant; A4 - 9:00 quadrant; A5 - tissue received within the center of the container. . The specimen is collected at 4:24 pm and placed in formalin at 4:33 pm on 10/30/13 and the approximate total fixation time is 22.5 hours. QAC/KCT .                                                        [   Final Report         ]                   Pathologist provided ICD-9: 233.0 .                               [   Final Report         ]                   CPT                                                        . [place]            No: [id number]           [address] [place], [place] [address]           Lindon Romp, MD         [phone]  Co: [id number]     . Patholgy report 10/31/2013    Final                   Value:========== TEST NAME ==========  ========= RESULTS =========  = REFERENCE RANGE =  PATHOLOGY  REPORT Pathology Report .                               [   Final Report         ]                   Material submitted:                                        Marland Kitchen PART A: DUODENAL BULB AND SMALL INTESTINE COLD BIOPSY PART B: STOMACH ANTRUM AND BODY COLD BIOPSY PART C: SIGMOID COLON POLYP COLD BIOPSY .                               [   Final Report         ]                   Pre-operative diagnosis:                                        . HEME + STOOL, FH POLYP, ANEMIA, RUQ PAIN UPPER ENDO, COLONOSCOPY .                               [   Final Report         ]                   Post-operative diagnosis:                                       . GASTRITIS, DUODENITIS, HIATAL HERNIA, COLON POLYP, INTERNAL HEMORRHOID .                               [   Final Report         ]                    Diagnosis: Part A                         : DUODENAL BULB AND SMALL INTESTINE COLD BIOPSY: - DUODENAL MUCOSA WITH INTACT VILLI. - NO ACTIVE INFLAMMATION, INTRAEPITHELIAL LYMPHOCYTOSIS, EROSION, OR GRANULOMAS. 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 - PROTON PUMP INHIBITOR EFFECT. - NO HELICOBACTER PYLORI IDENTIFIED IN HEMATOXYLIN AND EOSIN SECTIONS. . Part C: SIGMOID COLON POLYP COLD BIOPSY: - NONSPECIFIC CRYPT HYPERPLASIA AND FOCAL CHRONIC INFLAMMATION. - NO ACTIVE INFLAMMATION, DYSPLASIA, OR MALIGNANCY. . MSO/11/01/2013  .                               [  Final Report         ]                   Electronically signed:                                     Marland Kitchen Vivia Ewing, MD, Pathologist .                               [   Final Report         ]                   Gross description:                                         . A. Received in a formalin filled container labe                         led Pat Kocher and cold biopsy duodenal bulb and small intestine are three tan pink soft tissue fragments each 0.3 cm,  entirely submitted in cassette A1. . B. Received in a formalin filled container labeled Pat Kocher and cold biopsy stomach, antrum and body are multiple tan pink soft tissue fragments ranging from 0.1 to 0.3 cm, entirely submitted in cassette B1. . C. Received in a formalin filled container labeled Pat Kocher and cold biopsy polyp sigmoid colon is a 0.3 cm tan pink soft tissue fragment, entirely submitted in cassette C1. QAC/KCT .                               [   Final Report         ]                   Pathologist provided ICD-9: 535.50, 792.1 .                               [   Final Report         ]                   CPT                                                        . [id number], [id number], [phone]               [facility]            No: [id number]           [address], [place], [place] [address]           Lindon Romp, [place]  Co: [phone] [id number]       Assessment:  AVONELL LENIG is a 45 y.o. 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  She was diagnosed with right breast DCIS in 09/2011. She underwent lumpectomy on 09/21/2011.  Pathology revealed a 1.0 cm grade III DCIS.  Tumor was ER/PR negative. She received radiation.  She underwent TAH/BSO in 04/2012.    Comprehensive BRCA1/2 testing was negative on 09/29/2011.  Mammogram on 10/10/2013 revealed new calcifications at the 9:00 position. Biopsy confirmed recurrent high grade DCIS.  She underwent bilateral mastectomies with immediate reconstruction on 12/05/2013.  Pathology in the right breast, revealed a 2 mm area of grade III invasive ductal carcinoma within a 3.5-4 cm area of DCIS.  The invasive portion was ER/PR negative and HER-2/neu 3+.   Pathologic stage was T1aNxMx (at least stage IA).  Left breast was benign.  No lymph nodes were  removed.  She has a history of anemia.  EGD and colonoscopy on 11/01/2013 revealed gastritis and duodenitis.  There was a sigmoid polyp.  Pathology was benign.  Diet has improved.  She denies any melena or hematochezia.  She denies any hematuria.  She is s/p hysterectomy.  She is on oral iron.  Symptomatically, she notes ongoing tenderness on the right side s/p latissimus dorsi myocutaneous flap reconstructive surgery.  Exam is unremarkable.  Plan: 1.  Labs:  CBC with diff, CMP, CA27.29. 2.  Review BRCA1/2 testing from 09/2011- done. 3.  Referral to Care Program. 4.  RTC in 4 months for MD assessment and labs (CBC with diff, CMP, CA27.29).   Lequita Asal, MD  09/30/2015, 9:52 AM

## 2015-10-01 LAB — CANCER ANTIGEN 27.29: CA 27.29: 15.5 U/mL (ref 0.0–38.6)

## 2015-10-02 ENCOUNTER — Encounter: Payer: Self-pay | Admitting: Urology

## 2015-10-02 ENCOUNTER — Ambulatory Visit: Payer: Medicaid Other | Admitting: Urology

## 2015-10-09 ENCOUNTER — Encounter: Payer: Self-pay | Admitting: Urology

## 2015-10-09 ENCOUNTER — Ambulatory Visit (INDEPENDENT_AMBULATORY_CARE_PROVIDER_SITE_OTHER): Payer: Medicaid Other | Admitting: Urology

## 2015-10-09 VITALS — BP 89/64 | HR 88 | Ht 63.0 in | Wt 177.0 lb

## 2015-10-09 DIAGNOSIS — N3010 Interstitial cystitis (chronic) without hematuria: Secondary | ICD-10-CM | POA: Diagnosis not present

## 2015-10-09 DIAGNOSIS — N390 Urinary tract infection, site not specified: Secondary | ICD-10-CM | POA: Diagnosis not present

## 2015-10-09 LAB — URINALYSIS, COMPLETE
Bilirubin, UA: NEGATIVE
Glucose, UA: NEGATIVE
Ketones, UA: NEGATIVE
Leukocytes, UA: NEGATIVE
Nitrite, UA: NEGATIVE
PH UA: 7 (ref 5.0–7.5)
PROTEIN UA: NEGATIVE
Specific Gravity, UA: 1.025 (ref 1.005–1.030)
UUROB: 0.2 mg/dL (ref 0.2–1.0)

## 2015-10-09 LAB — BLADDER SCAN AMB NON-IMAGING: SCAN RESULT: 12

## 2015-10-09 LAB — MICROSCOPIC EXAMINATION: BACTERIA UA: NONE SEEN

## 2015-10-09 NOTE — Progress Notes (Signed)
Bladder Scan Patient : 12 ml Performed By: Larna Daughters
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
## 2016-01-27 ENCOUNTER — Inpatient Hospital Stay (HOSPITAL_BASED_OUTPATIENT_CLINIC_OR_DEPARTMENT_OTHER): Payer: Medicaid Other | Admitting: Hematology and Oncology

## 2016-01-27 ENCOUNTER — Encounter: Payer: Self-pay | Admitting: Hematology and Oncology

## 2016-01-27 ENCOUNTER — Inpatient Hospital Stay: Payer: Medicaid Other | Attending: Hematology and Oncology

## 2016-01-27 VITALS — BP 113/80 | HR 91 | Temp 96.9°F | Resp 16 | Ht 63.0 in | Wt 179.5 lb

## 2016-01-27 DIAGNOSIS — Z923 Personal history of irradiation: Secondary | ICD-10-CM

## 2016-01-27 DIAGNOSIS — F418 Other specified anxiety disorders: Secondary | ICD-10-CM | POA: Diagnosis not present

## 2016-01-27 DIAGNOSIS — F3181 Bipolar II disorder: Secondary | ICD-10-CM | POA: Diagnosis not present

## 2016-01-27 DIAGNOSIS — Z8614 Personal history of Methicillin resistant Staphylococcus aureus infection: Secondary | ICD-10-CM | POA: Diagnosis not present

## 2016-01-27 DIAGNOSIS — Z8719 Personal history of other diseases of the digestive system: Secondary | ICD-10-CM

## 2016-01-27 DIAGNOSIS — M5126 Other intervertebral disc displacement, lumbar region: Secondary | ICD-10-CM

## 2016-01-27 DIAGNOSIS — F4310 Post-traumatic stress disorder, unspecified: Secondary | ICD-10-CM

## 2016-01-27 DIAGNOSIS — J45909 Unspecified asthma, uncomplicated: Secondary | ICD-10-CM | POA: Diagnosis not present

## 2016-01-27 DIAGNOSIS — M797 Fibromyalgia: Secondary | ICD-10-CM | POA: Diagnosis not present

## 2016-01-27 DIAGNOSIS — D0512 Intraductal carcinoma in situ of left breast: Secondary | ICD-10-CM | POA: Diagnosis present

## 2016-01-27 DIAGNOSIS — Z8701 Personal history of pneumonia (recurrent): Secondary | ICD-10-CM | POA: Diagnosis not present

## 2016-01-27 DIAGNOSIS — Z79899 Other long term (current) drug therapy: Secondary | ICD-10-CM | POA: Diagnosis not present

## 2016-01-27 DIAGNOSIS — C50911 Malignant neoplasm of unspecified site of right female breast: Secondary | ICD-10-CM

## 2016-01-27 DIAGNOSIS — Z85828 Personal history of other malignant neoplasm of skin: Secondary | ICD-10-CM | POA: Diagnosis not present

## 2016-01-27 DIAGNOSIS — K589 Irritable bowel syndrome without diarrhea: Secondary | ICD-10-CM | POA: Diagnosis not present

## 2016-01-27 DIAGNOSIS — R5383 Other fatigue: Secondary | ICD-10-CM | POA: Diagnosis not present

## 2016-01-27 DIAGNOSIS — F3281 Premenstrual dysphoric disorder: Secondary | ICD-10-CM | POA: Diagnosis not present

## 2016-01-27 DIAGNOSIS — K219 Gastro-esophageal reflux disease without esophagitis: Secondary | ICD-10-CM | POA: Insufficient documentation

## 2016-01-27 DIAGNOSIS — Z86010 Personal history of colonic polyps: Secondary | ICD-10-CM | POA: Diagnosis not present

## 2016-01-27 DIAGNOSIS — E119 Type 2 diabetes mellitus without complications: Secondary | ICD-10-CM

## 2016-01-27 DIAGNOSIS — Z171 Estrogen receptor negative status [ER-]: Secondary | ICD-10-CM | POA: Diagnosis not present

## 2016-01-27 DIAGNOSIS — F329 Major depressive disorder, single episode, unspecified: Secondary | ICD-10-CM

## 2016-01-27 DIAGNOSIS — Z9013 Acquired absence of bilateral breasts and nipples: Secondary | ICD-10-CM | POA: Diagnosis not present

## 2016-01-27 DIAGNOSIS — Z87891 Personal history of nicotine dependence: Secondary | ICD-10-CM | POA: Diagnosis not present

## 2016-01-27 DIAGNOSIS — Z87440 Personal history of urinary (tract) infections: Secondary | ICD-10-CM | POA: Diagnosis not present

## 2016-01-27 DIAGNOSIS — Z8669 Personal history of other diseases of the nervous system and sense organs: Secondary | ICD-10-CM

## 2016-01-27 LAB — COMPREHENSIVE METABOLIC PANEL
ALT: 48 U/L (ref 14–54)
AST: 40 U/L (ref 15–41)
Albumin: 4.4 g/dL (ref 3.5–5.0)
Alkaline Phosphatase: 91 U/L (ref 38–126)
Anion gap: 7 (ref 5–15)
BUN: 11 mg/dL (ref 6–20)
CO2: 26 mmol/L (ref 22–32)
Calcium: 9.3 mg/dL (ref 8.9–10.3)
Chloride: 105 mmol/L (ref 101–111)
Creatinine, Ser: 0.78 mg/dL (ref 0.44–1.00)
GFR calc Af Amer: >60 mL/min (ref >60)
GFR calc non Af Amer: >60 mL/min (ref >60)
Glucose, Bld: 107 mg/dL — ABNORMAL HIGH (ref 65–99)
Potassium: 3.6 mmol/L (ref 3.5–5.1)
Sodium: 138 mmol/L (ref 135–145)
Total Bilirubin: 0.6 mg/dL (ref 0.3–1.2)
Total Protein: 7.5 g/dL (ref 6.5–8.1)

## 2016-01-27 LAB — CBC WITH DIFFERENTIAL/PLATELET
Basophils Absolute: 0 10*3/uL (ref 0–0.1)
Basophils Relative: 1 %
Eosinophils Absolute: 0.5 10*3/uL (ref 0–0.7)
Eosinophils Relative: 6 %
HCT: 40.7 % (ref 35.0–47.0)
Hemoglobin: 14.4 g/dL (ref 12.0–16.0)
Lymphocytes Relative: 25 %
Lymphs Abs: 1.9 10*3/uL (ref 1.0–3.6)
MCH: 32.7 pg (ref 26.0–34.0)
MCHC: 35.4 g/dL (ref 32.0–36.0)
MCV: 92.5 fL (ref 80.0–100.0)
Monocytes Absolute: 0.5 10*3/uL (ref 0.2–0.9)
Monocytes Relative: 6 %
Neutro Abs: 4.5 10*3/uL (ref 1.4–6.5)
Neutrophils Relative %: 62 %
Platelets: 254 10*3/uL (ref 150–440)
RBC: 4.4 MIL/uL (ref 3.80–5.20)
RDW: 12.7 % (ref 11.5–14.5)
WBC: 7.3 10*3/uL (ref 3.6–11.0)
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
## 2016-01-27 NOTE — Progress Notes (Signed)
Pt recently finished azithromycin for bronchitis.  IBS.  Always fatigued.  Recently diagnosed sleep apnea.  Hiatal hernia having a lot of trouble with acid reflux.  Dr. Tiffany Kocher follows will have appt in July to set up pt for upper endoscopy.

## 2016-01-28 LAB — CANCER ANTIGEN 27.29: CA 27.29: 15.7 U/mL (ref 0.0–38.6)

## 2016-02-06 ENCOUNTER — Other Ambulatory Visit: Payer: Self-pay | Admitting: Nurse Practitioner

## 2016-02-06 DIAGNOSIS — K219 Gastro-esophageal reflux disease without esophagitis: Secondary | ICD-10-CM

## 2016-02-18 ENCOUNTER — Ambulatory Visit
Admission: RE | Admit: 2016-02-18 | Discharge: 2016-02-18 | Disposition: A | Discharge status: Home / Self Care | Payer: Medicaid Other | Source: Ambulatory Visit | Attending: Nurse Practitioner | Admitting: Nurse Practitioner

## 2016-02-18 DIAGNOSIS — K222 Esophageal obstruction: Secondary | ICD-10-CM | POA: Diagnosis not present

## 2016-02-18 DIAGNOSIS — K449 Diaphragmatic hernia without obstruction or gangrene: Secondary | ICD-10-CM | POA: Insufficient documentation

## 2016-02-18 DIAGNOSIS — K219 Gastro-esophageal reflux disease without esophagitis: Secondary | ICD-10-CM | POA: Insufficient documentation

## 2016-02-21 ENCOUNTER — Encounter: Payer: Self-pay | Admitting: *Deleted

## 2016-02-25 ENCOUNTER — Ambulatory Visit: Payer: Medicaid Other | Attending: Internal Medicine

## 2016-02-25 ENCOUNTER — Encounter: Payer: Self-pay | Admitting: *Deleted

## 2016-02-25 DIAGNOSIS — M546 Pain in thoracic spine: Secondary | ICD-10-CM | POA: Diagnosis present

## 2016-02-25 DIAGNOSIS — M545 Low back pain: Secondary | ICD-10-CM | POA: Diagnosis present
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
## 2016-02-25 NOTE — Patient Instructions (Addendum)
   Quadriceps Set (Prone)   With toes supporting lower legs, squeeze rear end muscles and tighten thigh muscles to straighten knees. Hold _5___ seconds. Relax. Repeat __10__ times per set. Do ___3_ sets per session. Do ___1_ sessions per day.  http://orth.exer.us/726   Copyright  VHI. All rights reserved.     Sitting on a chair with the yellow band around your knees, stand up without letting your knees turn in.  Use hands as needed.  Repeat 5 times. Perform 3 sets daily.  Increase number or repetitions or sets as tolerated.

## 2016-02-26 ENCOUNTER — Ambulatory Visit: Payer: Medicaid Other | Admitting: Anesthesiology

## 2016-02-26 ENCOUNTER — Ambulatory Visit
Admission: RE | Admit: 2016-02-26 | Discharge: 2016-02-26 | Disposition: A | Discharge status: Home / Self Care | Payer: Medicaid Other | Source: Ambulatory Visit | Attending: Unknown Physician Specialty | Admitting: Unknown Physician Specialty

## 2016-02-26 ENCOUNTER — Encounter
Admission: RE | Disposition: A | Discharge status: Home / Self Care | Payer: Self-pay | Source: Ambulatory Visit | Attending: Unknown Physician Specialty

## 2016-02-26 DIAGNOSIS — Z90710 Acquired absence of both cervix and uterus: Secondary | ICD-10-CM | POA: Insufficient documentation

## 2016-02-26 DIAGNOSIS — J45909 Unspecified asthma, uncomplicated: Secondary | ICD-10-CM | POA: Insufficient documentation

## 2016-02-26 DIAGNOSIS — F419 Anxiety disorder, unspecified: Secondary | ICD-10-CM | POA: Diagnosis not present

## 2016-02-26 DIAGNOSIS — Z87891 Personal history of nicotine dependence: Secondary | ICD-10-CM | POA: Insufficient documentation

## 2016-02-26 DIAGNOSIS — K222 Esophageal obstruction: Secondary | ICD-10-CM | POA: Insufficient documentation

## 2016-02-26 DIAGNOSIS — Z853 Personal history of malignant neoplasm of breast: Secondary | ICD-10-CM | POA: Diagnosis not present

## 2016-02-26 DIAGNOSIS — Z91013 Allergy to seafood: Secondary | ICD-10-CM | POA: Insufficient documentation

## 2016-02-26 DIAGNOSIS — Z79899 Other long term (current) drug therapy: Secondary | ICD-10-CM | POA: Diagnosis not present

## 2016-02-26 DIAGNOSIS — M797 Fibromyalgia: Secondary | ICD-10-CM | POA: Diagnosis not present

## 2016-02-26 DIAGNOSIS — Z885 Allergy status to narcotic agent status: Secondary | ICD-10-CM | POA: Diagnosis not present

## 2016-02-26 DIAGNOSIS — K449 Diaphragmatic hernia without obstruction or gangrene: Secondary | ICD-10-CM | POA: Insufficient documentation

## 2016-02-26 DIAGNOSIS — K219 Gastro-esophageal reflux disease without esophagitis: Secondary | ICD-10-CM | POA: Diagnosis not present

## 2016-02-26 DIAGNOSIS — K319 Disease of stomach and duodenum, unspecified: Secondary | ICD-10-CM | POA: Diagnosis not present

## 2016-02-26 DIAGNOSIS — F410 Panic disorder [episodic paroxysmal anxiety] without agoraphobia: Secondary | ICD-10-CM | POA: Diagnosis not present

## 2016-02-26 DIAGNOSIS — Z888 Allergy status to other drugs, medicaments and biological substances status: Secondary | ICD-10-CM | POA: Insufficient documentation

## 2016-02-26 DIAGNOSIS — R12 Heartburn: Secondary | ICD-10-CM | POA: Diagnosis present

## 2016-02-26 DIAGNOSIS — Z85828 Personal history of other malignant neoplasm of skin: Secondary | ICD-10-CM | POA: Insufficient documentation

## 2016-02-26 DIAGNOSIS — E785 Hyperlipidemia, unspecified: Secondary | ICD-10-CM | POA: Diagnosis not present

## 2016-02-26 DIAGNOSIS — Z886 Allergy status to analgesic agent status: Secondary | ICD-10-CM | POA: Insufficient documentation

## 2016-02-26 DIAGNOSIS — F319 Bipolar disorder, unspecified: Secondary | ICD-10-CM | POA: Insufficient documentation

## 2016-02-26 DIAGNOSIS — Z880 Allergy status to penicillin: Secondary | ICD-10-CM | POA: Insufficient documentation

## 2016-02-26 HISTORY — DX: Anemia, unspecified: D64.9

## 2016-02-26 HISTORY — PX: ESOPHAGOGASTRODUODENOSCOPY (EGD) WITH PROPOFOL: SHX5813

## 2016-02-26 SURGERY — ESOPHAGOGASTRODUODENOSCOPY (EGD) WITH PROPOFOL
Anesthesia: General

## 2016-02-26 MED ORDER — PROPOFOL 500 MG/50ML IV EMUL
INTRAVENOUS | Status: DC | PRN
  Administered 2016-02-26: 100 ug/kg/min via INTRAVENOUS

## 2016-02-26 MED ORDER — LIDOCAINE HCL (PF) 2 % IJ SOLN
INTRAMUSCULAR | Status: DC | PRN
  Administered 2016-02-26: 50 mg

## 2016-02-26 MED ORDER — FENTANYL CITRATE (PF) 100 MCG/2ML IJ SOLN
INTRAMUSCULAR | Status: DC | PRN
  Administered 2016-02-26: 50 ug via INTRAVENOUS

## 2016-02-26 MED ORDER — MIDAZOLAM HCL 5 MG/5ML IJ SOLN
INTRAMUSCULAR | Status: DC | PRN
  Administered 2016-02-26: 2 mg via INTRAVENOUS

## 2016-02-26 MED ORDER — PROPOFOL 10 MG/ML IV BOLUS
INTRAVENOUS | Status: DC | PRN
  Administered 2016-02-26: 50 mg via INTRAVENOUS

## 2016-02-26 MED ORDER — SODIUM CHLORIDE 0.9 % IV SOLN
INTRAVENOUS | Status: DC
  Administered 2016-02-26: 1000 mL via INTRAVENOUS

## 2016-02-26 MED ORDER — GLYCOPYRROLATE 0.2 MG/ML IJ SOLN
INTRAMUSCULAR | Status: DC | PRN
  Administered 2016-02-26: 0.2 mg via INTRAVENOUS

## 2016-02-26 NOTE — Anesthesia Postprocedure Evaluation (Signed)
Anesthesia Post Note  Patient: BRADYN COLESTOCK  Procedure(s) Performed: Procedure(s) (LRB): ESOPHAGOGASTRODUODENOSCOPY (EGD) WITH PROPOFOL (N/A)  Patient location during evaluation: PACU Anesthesia Type: General Level of consciousness: awake and alert and oriented Pain management: pain level controlled Vital Signs Assessment: post-procedure vital signs reviewed and stable Respiratory status: spontaneous breathing, nonlabored ventilation and respiratory function stable Cardiovascular status: blood pressure returned to baseline and stable Postop Assessment: no signs of nausea or vomiting Anesthetic complications: no    Last Vitals:  Vitals:   02/26/16 0949 02/26/16 1114  BP: 106/70 113/67  Pulse: 92 93  Resp: 19 14  Temp: 37.2 C (!) 36.1 C    Last Pain:  Vitals:   02/26/16 1114  TempSrc: Tympanic                 Maitri Schnoebelen

## 2016-02-26 NOTE — Op Note (Signed)
[facility] Gastroenterology Patient Name: [name] Procedure Date: 02/26/2016 10:57 AM MRN: [id number] Account #: [id number] Date of Birth: 1971-02-10 Admit Type: Outpatient Age: 45 Room: [facility] ENDO ROOM 4 Gender: Female Note Status: Finalized Procedure:            Upper GI endoscopy Indications:          Heartburn, Suspected esophageal reflux, Abnormal UGI                        series Providers:            [name], MD Medicines:            Propofol per Anesthesia Complications:        No immediate complications. Procedure:            Pre-Anesthesia Assessment:                       - After reviewing the risks and benefits, the patient                        was deemed in satisfactory condition to undergo the                        procedure.                       After obtaining informed consent, the endoscope was                        passed under direct vision. Throughout the procedure,                        the patient's blood pressure, pulse, and oxygen                        saturations were monitored continuously. The Endoscope                        was introduced through the mouth, and advanced to the                        second part of duodenum. The upper GI endoscopy was                        accomplished without difficulty. The patient tolerated                        the procedure well. The upper GI endoscopy was                        accomplished without difficulty. The patient tolerated                        the procedure well. Findings:      A mild Schatzki ring (acquired) was found at the gastroesophageal       junction. This was not dilated because itt would make her reflux worse.       GEJ 33cm      A small hiatal hernia was present. About 3cm.      Diffuse moderate inflammation characterized by erythema and granularity  was found in the gastric body and in the gastric antrum. Biopsies were       taken with a cold  forceps for histology. Biopsies were taken with a cold       forceps for Helicobacter pylori testing.      The examined duodenum was normal. Impression:           - Mild Schatzki ring.                       - Small hiatal hernia.                       - Gastritis. Biopsied.                       - Normal examined duodenum. Recommendation:       - Await pathology results. [name], MD 02/26/2016 11:15:42 AM This report has been signed electronically. Number of Addenda: 0 Note Initiated On: 02/26/2016 10:57 AM      [facility]

## 2016-02-26 NOTE — Transfer of Care (Signed)
Immediate Anesthesia Transfer of Care Note  Patient: [name]  Procedure(s) Performed: Procedure(s): ESOPHAGOGASTRODUODENOSCOPY (EGD) WITH PROPOFOL (N/A)  Patient Location: PACU  Anesthesia Type:General  Level of Consciousness: sedated  Airway & Oxygen Therapy: Patient Spontanous Breathing and Patient connected to nasal cannula oxygen  Post-op Assessment: Report given to RN and Post -op Vital signs reviewed and stable  Post vital signs: Reviewed and stable  Last Vitals:  Vitals:   02/26/16 0949  BP: 106/70  Pulse: 92  Resp: 19  Temp: 37.2 C    Last Pain:  Vitals:   02/26/16 0949  TempSrc: Tympanic         Complications: No apparent anesthesia complications
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
## 2016-02-27 ENCOUNTER — Encounter: Payer: Self-pay | Admitting: Unknown Physician Specialty

## 2016-02-27 LAB — SURGICAL PATHOLOGY

## 2016-03-31 ENCOUNTER — Telehealth (HOSPITAL_COMMUNITY): Payer: Self-pay

## 2016-03-31 NOTE — Telephone Encounter (Signed)
[name] ([facility]) l/m to call back to schedule apt

## 2016-05-28 ENCOUNTER — Other Ambulatory Visit: Payer: Self-pay | Admitting: *Deleted

## 2016-05-28 ENCOUNTER — Inpatient Hospital Stay: Payer: Medicaid Other | Attending: Hematology and Oncology

## 2016-05-28 ENCOUNTER — Inpatient Hospital Stay (HOSPITAL_BASED_OUTPATIENT_CLINIC_OR_DEPARTMENT_OTHER): Payer: Medicaid Other | Admitting: Hematology and Oncology

## 2016-05-28 VITALS — BP 102/71 | HR 64 | Resp 18 | Wt 180.3 lb

## 2016-05-28 DIAGNOSIS — E669 Obesity, unspecified: Secondary | ICD-10-CM | POA: Insufficient documentation

## 2016-05-28 DIAGNOSIS — C50811 Malignant neoplasm of overlapping sites of right female breast: Secondary | ICD-10-CM | POA: Diagnosis not present

## 2016-05-28 DIAGNOSIS — M797 Fibromyalgia: Secondary | ICD-10-CM | POA: Diagnosis not present

## 2016-05-28 DIAGNOSIS — N3020 Other chronic cystitis without hematuria: Secondary | ICD-10-CM | POA: Insufficient documentation

## 2016-05-28 DIAGNOSIS — Z808 Family history of malignant neoplasm of other organs or systems: Secondary | ICD-10-CM

## 2016-05-28 DIAGNOSIS — F3281 Premenstrual dysphoric disorder: Secondary | ICD-10-CM | POA: Diagnosis not present

## 2016-05-28 DIAGNOSIS — F3181 Bipolar II disorder: Secondary | ICD-10-CM | POA: Insufficient documentation

## 2016-05-28 DIAGNOSIS — Z8051 Family history of malignant neoplasm of kidney: Secondary | ICD-10-CM

## 2016-05-28 DIAGNOSIS — M5136 Other intervertebral disc degeneration, lumbar region: Secondary | ICD-10-CM | POA: Diagnosis not present

## 2016-05-28 DIAGNOSIS — Z85828 Personal history of other malignant neoplasm of skin: Secondary | ICD-10-CM | POA: Insufficient documentation

## 2016-05-28 DIAGNOSIS — K449 Diaphragmatic hernia without obstruction or gangrene: Secondary | ICD-10-CM | POA: Insufficient documentation

## 2016-05-28 DIAGNOSIS — Z9013 Acquired absence of bilateral breasts and nipples: Secondary | ICD-10-CM | POA: Diagnosis not present

## 2016-05-28 DIAGNOSIS — Z87891 Personal history of nicotine dependence: Secondary | ICD-10-CM

## 2016-05-28 DIAGNOSIS — F410 Panic disorder [episodic paroxysmal anxiety] without agoraphobia: Secondary | ICD-10-CM | POA: Diagnosis not present

## 2016-05-28 DIAGNOSIS — K219 Gastro-esophageal reflux disease without esophagitis: Secondary | ICD-10-CM | POA: Diagnosis not present

## 2016-05-28 DIAGNOSIS — C50911 Malignant neoplasm of unspecified site of right female breast: Secondary | ICD-10-CM

## 2016-05-28 DIAGNOSIS — Z170 Estrogen receptor positive status [ER+]: Secondary | ICD-10-CM | POA: Insufficient documentation

## 2016-05-28 DIAGNOSIS — Z8781 Personal history of (healed) traumatic fracture: Secondary | ICD-10-CM | POA: Insufficient documentation

## 2016-05-28 DIAGNOSIS — Z79899 Other long term (current) drug therapy: Secondary | ICD-10-CM | POA: Diagnosis not present

## 2016-05-28 DIAGNOSIS — J45909 Unspecified asthma, uncomplicated: Secondary | ICD-10-CM

## 2016-05-28 DIAGNOSIS — K222 Esophageal obstruction: Secondary | ICD-10-CM

## 2016-05-28 DIAGNOSIS — Z8669 Personal history of other diseases of the nervous system and sense organs: Secondary | ICD-10-CM

## 2016-05-28 DIAGNOSIS — K589 Irritable bowel syndrome without diarrhea: Secondary | ICD-10-CM

## 2016-05-28 DIAGNOSIS — K760 Fatty (change of) liver, not elsewhere classified: Secondary | ICD-10-CM

## 2016-05-28 DIAGNOSIS — Z8041 Family history of malignant neoplasm of ovary: Secondary | ICD-10-CM

## 2016-05-28 DIAGNOSIS — Z801 Family history of malignant neoplasm of trachea, bronchus and lung: Secondary | ICD-10-CM

## 2016-05-28 DIAGNOSIS — Z171 Estrogen receptor negative status [ER-]: Principal | ICD-10-CM

## 2016-05-28 DIAGNOSIS — F329 Major depressive disorder, single episode, unspecified: Secondary | ICD-10-CM

## 2016-05-28 DIAGNOSIS — D649 Anemia, unspecified: Secondary | ICD-10-CM

## 2016-05-28 LAB — CBC WITH DIFFERENTIAL/PLATELET
Basophils Absolute: 0 10*3/uL (ref 0–0.1)
Basophils Relative: 1 %
Eosinophils Absolute: 0.3 10*3/uL (ref 0–0.7)
Eosinophils Relative: 6 %
HCT: 41.7 % (ref 35.0–47.0)
Hemoglobin: 14.4 g/dL (ref 12.0–16.0)
Lymphocytes Relative: 25 %
Lymphs Abs: 1.5 10*3/uL (ref 1.0–3.6)
MCH: 30.2 pg (ref 26.0–34.0)
MCHC: 34.5 g/dL (ref 32.0–36.0)
MCV: 87.6 fL (ref 80.0–100.0)
Monocytes Absolute: 0.4 10*3/uL (ref 0.2–0.9)
Monocytes Relative: 7 %
Neutro Abs: 3.6 10*3/uL (ref 1.4–6.5)
Neutrophils Relative %: 61 %
Platelets: 251 10*3/uL (ref 150–440)
RBC: 4.76 MIL/uL (ref 3.80–5.20)
RDW: 12.4 % (ref 11.5–14.5)
WBC: 5.9 10*3/uL (ref 3.6–11.0)

## 2016-05-28 LAB — COMPREHENSIVE METABOLIC PANEL
ALT: 59 U/L — ABNORMAL HIGH (ref 14–54)
AST: 46 U/L — ABNORMAL HIGH (ref 15–41)
Albumin: 4.6 g/dL (ref 3.5–5.0)
Alkaline Phosphatase: 80 U/L (ref 38–126)
Anion gap: 9 (ref 5–15)
BUN: 13 mg/dL (ref 6–20)
CO2: 26 mmol/L (ref 22–32)
Calcium: 9.1 mg/dL (ref 8.9–10.3)
Chloride: 100 mmol/L — ABNORMAL LOW (ref 101–111)
Creatinine, Ser: 0.78 mg/dL (ref 0.44–1.00)
GFR calc Af Amer: >60 mL/min (ref >60)
GFR calc non Af Amer: >60 mL/min (ref >60)
Glucose, Bld: 96 mg/dL (ref 65–99)
Potassium: 3.7 mmol/L (ref 3.5–5.1)
Sodium: 135 mmol/L (ref 135–145)
Total Bilirubin: 0.5 mg/dL (ref 0.3–1.2)
Total Protein: 7.5 g/dL (ref 6.5–8.1)
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
## 2016-05-29 LAB — CANCER ANTIGEN 27.29: CA 27.29: 15.9 U/mL (ref 0.0–38.6)

## 2016-05-31 ENCOUNTER — Encounter: Payer: Self-pay | Admitting: Hematology and Oncology

## 2016-08-07 ENCOUNTER — Ambulatory Visit: Payer: Medicaid Other | Admitting: Physical Therapy

## 2016-08-19 ENCOUNTER — Encounter: Payer: Medicaid Other | Admitting: Physical Therapy

## 2016-09-02 ENCOUNTER — Encounter: Payer: Self-pay | Admitting: Physical Therapy

## 2016-09-02 ENCOUNTER — Ambulatory Visit: Payer: Medicaid Other | Attending: Anesthesiology | Admitting: Physical Therapy

## 2016-09-02 DIAGNOSIS — R262 Difficulty in walking, not elsewhere classified: Secondary | ICD-10-CM | POA: Diagnosis present

## 2016-09-02 DIAGNOSIS — M545 Low back pain: Secondary | ICD-10-CM | POA: Diagnosis present

## 2016-09-02 DIAGNOSIS — M6281 Muscle weakness (generalized): Secondary | ICD-10-CM | POA: Diagnosis present

## 2016-09-02 DIAGNOSIS — G8929 Other chronic pain: Secondary | ICD-10-CM | POA: Diagnosis present
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
## 2016-09-15 NOTE — Addendum Note (Signed)
Addended by: Pura Spice on: 09/15/2016 09:20 AM   Modules accepted: Orders

## 2016-10-26 ENCOUNTER — Other Ambulatory Visit: Payer: Self-pay | Admitting: Internal Medicine

## 2016-10-26 DIAGNOSIS — R109 Unspecified abdominal pain: Secondary | ICD-10-CM

## 2016-10-26 DIAGNOSIS — R110 Nausea: Secondary | ICD-10-CM

## 2016-10-28 ENCOUNTER — Ambulatory Visit
Admission: RE | Admit: 2016-10-28 | Discharge: 2016-10-28 | Disposition: A | Discharge status: Home / Self Care | Payer: Medicaid Other | Source: Ambulatory Visit | Attending: Internal Medicine | Admitting: Internal Medicine

## 2016-10-28 DIAGNOSIS — R932 Abnormal findings on diagnostic imaging of liver and biliary tract: Secondary | ICD-10-CM | POA: Diagnosis not present

## 2016-10-28 DIAGNOSIS — R110 Nausea: Secondary | ICD-10-CM

## 2016-10-28 DIAGNOSIS — R109 Unspecified abdominal pain: Secondary | ICD-10-CM

## 2016-11-26 ENCOUNTER — Inpatient Hospital Stay (HOSPITAL_BASED_OUTPATIENT_CLINIC_OR_DEPARTMENT_OTHER): Payer: Medicaid Other | Admitting: Hematology and Oncology

## 2016-11-26 ENCOUNTER — Encounter: Payer: Self-pay | Admitting: Hematology and Oncology

## 2016-11-26 ENCOUNTER — Inpatient Hospital Stay: Payer: Medicaid Other | Attending: Hematology and Oncology

## 2016-11-26 VITALS — BP 111/72 | HR 85 | Temp 97.7°F | Resp 18 | Wt 200.4 lb

## 2016-11-26 DIAGNOSIS — Z90722 Acquired absence of ovaries, bilateral: Secondary | ICD-10-CM

## 2016-11-26 DIAGNOSIS — K589 Irritable bowel syndrome without diarrhea: Secondary | ICD-10-CM

## 2016-11-26 DIAGNOSIS — Z801 Family history of malignant neoplasm of trachea, bronchus and lung: Secondary | ICD-10-CM | POA: Insufficient documentation

## 2016-11-26 DIAGNOSIS — Z885 Allergy status to narcotic agent status: Secondary | ICD-10-CM

## 2016-11-26 DIAGNOSIS — Z90710 Acquired absence of both cervix and uterus: Secondary | ICD-10-CM

## 2016-11-26 DIAGNOSIS — Z171 Estrogen receptor negative status [ER-]: Secondary | ICD-10-CM | POA: Diagnosis not present

## 2016-11-26 DIAGNOSIS — J45909 Unspecified asthma, uncomplicated: Secondary | ICD-10-CM | POA: Diagnosis not present

## 2016-11-26 DIAGNOSIS — K760 Fatty (change of) liver, not elsewhere classified: Secondary | ICD-10-CM

## 2016-11-26 DIAGNOSIS — K449 Diaphragmatic hernia without obstruction or gangrene: Secondary | ICD-10-CM

## 2016-11-26 DIAGNOSIS — Z8042 Family history of malignant neoplasm of prostate: Secondary | ICD-10-CM

## 2016-11-26 DIAGNOSIS — K222 Esophageal obstruction: Secondary | ICD-10-CM

## 2016-11-26 DIAGNOSIS — Z808 Family history of malignant neoplasm of other organs or systems: Secondary | ICD-10-CM | POA: Insufficient documentation

## 2016-11-26 DIAGNOSIS — Z9013 Acquired absence of bilateral breasts and nipples: Secondary | ICD-10-CM | POA: Diagnosis not present

## 2016-11-26 DIAGNOSIS — Z87891 Personal history of nicotine dependence: Secondary | ICD-10-CM | POA: Insufficient documentation

## 2016-11-26 DIAGNOSIS — Z8051 Family history of malignant neoplasm of kidney: Secondary | ICD-10-CM | POA: Diagnosis not present

## 2016-11-26 DIAGNOSIS — D649 Anemia, unspecified: Secondary | ICD-10-CM | POA: Insufficient documentation

## 2016-11-26 DIAGNOSIS — Z880 Allergy status to penicillin: Secondary | ICD-10-CM | POA: Insufficient documentation

## 2016-11-26 DIAGNOSIS — Z86000 Personal history of in-situ neoplasm of breast: Secondary | ICD-10-CM

## 2016-11-26 DIAGNOSIS — R1110 Vomiting, unspecified: Secondary | ICD-10-CM | POA: Diagnosis not present

## 2016-11-26 DIAGNOSIS — D0511 Intraductal carcinoma in situ of right breast: Secondary | ICD-10-CM

## 2016-11-26 DIAGNOSIS — K219 Gastro-esophageal reflux disease without esophagitis: Secondary | ICD-10-CM

## 2016-11-26 DIAGNOSIS — D125 Benign neoplasm of sigmoid colon: Secondary | ICD-10-CM | POA: Insufficient documentation

## 2016-11-26 DIAGNOSIS — Z79899 Other long term (current) drug therapy: Secondary | ICD-10-CM | POA: Insufficient documentation

## 2016-11-26 DIAGNOSIS — M797 Fibromyalgia: Secondary | ICD-10-CM | POA: Diagnosis not present

## 2016-11-26 DIAGNOSIS — C50911 Malignant neoplasm of unspecified site of right female breast: Secondary | ICD-10-CM

## 2016-11-26 DIAGNOSIS — Z85828 Personal history of other malignant neoplasm of skin: Secondary | ICD-10-CM | POA: Insufficient documentation

## 2016-11-26 LAB — COMPREHENSIVE METABOLIC PANEL
ALT: 49 U/L (ref 14–54)
AST: 34 U/L (ref 15–41)
Albumin: 4 g/dL (ref 3.5–5.0)
Alkaline Phosphatase: 80 U/L (ref 38–126)
Anion gap: 5 (ref 5–15)
BUN: 11 mg/dL (ref 6–20)
CO2: 30 mmol/L (ref 22–32)
Calcium: 9.3 mg/dL (ref 8.9–10.3)
Chloride: 105 mmol/L (ref 101–111)
Creatinine, Ser: 0.77 mg/dL (ref 0.44–1.00)
GFR calc Af Amer: >60 mL/min (ref >60)
GFR calc non Af Amer: >60 mL/min (ref >60)
Glucose, Bld: 107 mg/dL — ABNORMAL HIGH (ref 65–99)
Potassium: 3.7 mmol/L (ref 3.5–5.1)
Sodium: 140 mmol/L (ref 135–145)
Total Bilirubin: 0.6 mg/dL (ref 0.3–1.2)
Total Protein: 7.1 g/dL (ref 6.5–8.1)

## 2016-11-26 LAB — CBC WITH DIFFERENTIAL/PLATELET
Basophils Absolute: 0.1 10*3/uL (ref 0–0.1)
Basophils Relative: 1 %
Eosinophils Absolute: 0.2 10*3/uL (ref 0–0.7)
Eosinophils Relative: 3 %
HCT: 37.2 % (ref 35.0–47.0)
Hemoglobin: 13 g/dL (ref 12.0–16.0)
Lymphocytes Relative: 22 %
Lymphs Abs: 1.4 10*3/uL (ref 1.0–3.6)
MCH: 30.5 pg (ref 26.0–34.0)
MCHC: 35 g/dL (ref 32.0–36.0)
MCV: 87.2 fL (ref 80.0–100.0)
Monocytes Absolute: 0.5 10*3/uL (ref 0.2–0.9)
Monocytes Relative: 8 %
Neutro Abs: 4.4 10*3/uL (ref 1.4–6.5)
Neutrophils Relative %: 66 %
Platelets: 228 10*3/uL (ref 150–440)
RBC: 4.26 MIL/uL (ref 3.80–5.20)
RDW: 13.4 % (ref 11.5–14.5)
WBC: 6.5 10*3/uL (ref 3.6–11.0)
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
## 2016-11-26 NOTE — Progress Notes (Signed)
Patient gets pains in her breast at times.  States it feels like nerve pain.  Also is not eating well.  She is having problems with GERD and has projectile vomiting when she eats.  She will be seeing Dr. Vira Agar next week.

## 2016-11-27 LAB — CANCER ANTIGEN 27.29: CA 27.29: 11.5 U/mL (ref 0.0–38.6)

## 2016-12-01 ENCOUNTER — Encounter: Payer: Self-pay | Admitting: Internal Medicine

## 2016-12-01 ENCOUNTER — Inpatient Hospital Stay
Admission: AD | Admit: 2016-12-01 | Discharge: 2016-12-03 | DRG: 871 | Disposition: A | Discharge status: Home / Self Care | Payer: Medicaid Other | Source: Ambulatory Visit | Attending: Internal Medicine | Admitting: Internal Medicine

## 2016-12-01 DIAGNOSIS — J690 Pneumonitis due to inhalation of food and vomit: Secondary | ICD-10-CM | POA: Diagnosis present

## 2016-12-01 DIAGNOSIS — Z90710 Acquired absence of both cervix and uterus: Secondary | ICD-10-CM

## 2016-12-01 DIAGNOSIS — R509 Fever, unspecified: Secondary | ICD-10-CM | POA: Diagnosis present

## 2016-12-01 DIAGNOSIS — R911 Solitary pulmonary nodule: Secondary | ICD-10-CM

## 2016-12-01 DIAGNOSIS — J45909 Unspecified asthma, uncomplicated: Secondary | ICD-10-CM | POA: Diagnosis present

## 2016-12-01 DIAGNOSIS — F410 Panic disorder [episodic paroxysmal anxiety] without agoraphobia: Secondary | ICD-10-CM | POA: Diagnosis present

## 2016-12-01 DIAGNOSIS — Z85828 Personal history of other malignant neoplasm of skin: Secondary | ICD-10-CM | POA: Diagnosis not present

## 2016-12-01 DIAGNOSIS — Z7951 Long term (current) use of inhaled steroids: Secondary | ICD-10-CM | POA: Diagnosis not present

## 2016-12-01 DIAGNOSIS — Z79899 Other long term (current) drug therapy: Secondary | ICD-10-CM

## 2016-12-01 DIAGNOSIS — E876 Hypokalemia: Secondary | ICD-10-CM | POA: Diagnosis present

## 2016-12-01 DIAGNOSIS — Z853 Personal history of malignant neoplasm of breast: Secondary | ICD-10-CM

## 2016-12-01 DIAGNOSIS — Z823 Family history of stroke: Secondary | ICD-10-CM | POA: Diagnosis not present

## 2016-12-01 DIAGNOSIS — A419 Sepsis, unspecified organism: Secondary | ICD-10-CM | POA: Diagnosis present

## 2016-12-01 DIAGNOSIS — F3181 Bipolar II disorder: Secondary | ICD-10-CM | POA: Diagnosis present

## 2016-12-01 DIAGNOSIS — Z8049 Family history of malignant neoplasm of other genital organs: Secondary | ICD-10-CM | POA: Diagnosis not present

## 2016-12-01 DIAGNOSIS — F4310 Post-traumatic stress disorder, unspecified: Secondary | ICD-10-CM | POA: Diagnosis present

## 2016-12-01 DIAGNOSIS — Z87891 Personal history of nicotine dependence: Secondary | ICD-10-CM

## 2016-12-01 DIAGNOSIS — Z801 Family history of malignant neoplasm of trachea, bronchus and lung: Secondary | ICD-10-CM | POA: Diagnosis not present

## 2016-12-01 DIAGNOSIS — K219 Gastro-esophageal reflux disease without esophagitis: Secondary | ICD-10-CM | POA: Diagnosis present

## 2016-12-01 DIAGNOSIS — T17998A Other foreign object in respiratory tract, part unspecified causing other injury, initial encounter: Secondary | ICD-10-CM

## 2016-12-01 DIAGNOSIS — M797 Fibromyalgia: Secondary | ICD-10-CM | POA: Diagnosis present

## 2016-12-01 DIAGNOSIS — Z8051 Family history of malignant neoplasm of kidney: Secondary | ICD-10-CM | POA: Diagnosis not present

## 2016-12-01 LAB — URINALYSIS, COMPLETE (UACMP) WITH MICROSCOPIC
BACTERIA UA: NONE SEEN
Bilirubin Urine: NEGATIVE
Glucose, UA: 50 mg/dL — AB
Hgb urine dipstick: NEGATIVE
Ketones, ur: NEGATIVE mg/dL
Leukocytes, UA: NEGATIVE
Nitrite: NEGATIVE
PH: 7 (ref 5.0–8.0)
Protein, ur: NEGATIVE mg/dL
SPECIFIC GRAVITY, URINE: 1.016 (ref 1.005–1.030)

## 2016-12-01 LAB — COMPREHENSIVE METABOLIC PANEL
ALK PHOS: 79 U/L (ref 38–126)
ALT: 51 U/L (ref 14–54)
ANION GAP: 7 (ref 5–15)
AST: 47 U/L — AB (ref 15–41)
Albumin: 4.3 g/dL (ref 3.5–5.0)
BILIRUBIN TOTAL: 0.6 mg/dL (ref 0.3–1.2)
BUN: 6 mg/dL (ref 6–20)
CALCIUM: 9.1 mg/dL (ref 8.9–10.3)
CO2: 26 mmol/L (ref 22–32)
Chloride: 105 mmol/L (ref 101–111)
Creatinine, Ser: 0.69 mg/dL (ref 0.44–1.00)
GFR calc Af Amer: >60 mL/min (ref >60)
Glucose, Bld: 99 mg/dL (ref 65–99)
POTASSIUM: 3.3 mmol/L — AB (ref 3.5–5.1)
Sodium: 138 mmol/L (ref 135–145)
TOTAL PROTEIN: 7.5 g/dL (ref 6.5–8.1)

## 2016-12-01 LAB — CBC WITH DIFFERENTIAL/PLATELET
Basophils Absolute: 0.1 10*3/uL (ref 0–0.1)
Basophils Relative: 1 %
Eosinophils Absolute: 0 10*3/uL (ref 0–0.7)
Eosinophils Relative: 0 %
HCT: 38.5 % (ref 35.0–47.0)
HEMOGLOBIN: 13.4 g/dL (ref 12.0–16.0)
LYMPHS PCT: 7 %
Lymphs Abs: 0.7 10*3/uL — ABNORMAL LOW (ref 1.0–3.6)
MCH: 30.1 pg (ref 26.0–34.0)
MCHC: 34.9 g/dL (ref 32.0–36.0)
MCV: 86.2 fL (ref 80.0–100.0)
MONO ABS: 0.6 10*3/uL (ref 0.2–0.9)
MONOS PCT: 6 %
NEUTROS ABS: 9 10*3/uL — AB (ref 1.4–6.5)
Neutrophils Relative %: 86 %
Platelets: 233 10*3/uL (ref 150–440)
RBC: 4.46 MIL/uL (ref 3.80–5.20)
RDW: 13.3 % (ref 11.5–14.5)
WBC: 10.3 10*3/uL (ref 3.6–11.0)

## 2016-12-01 LAB — LACTIC ACID, PLASMA
Lactic Acid, Venous: 2.5 mmol/L (ref 0.5–1.9)
Lactic Acid, Venous: 2 mmol/L (ref 0.5–1.9)

## 2016-12-01 LAB — PREGNANCY, URINE: PREG TEST UR: NEGATIVE

## 2016-12-01 LAB — LIPASE, BLOOD: LIPASE: 22 U/L (ref 11–51)

## 2016-12-01 LAB — TSH: TSH: 1.103 u[IU]/mL (ref 0.350–4.500)

## 2016-12-01 MED ORDER — SODIUM CHLORIDE 0.9 % IV SOLN
Freq: Once | INTRAVENOUS | Status: AC
  Administered 2016-12-01: 13:00:00 via INTRAVENOUS

## 2016-12-01 MED ORDER — ACETAMINOPHEN 325 MG PO TABS: 650.0000 mg | ORAL_TABLET | Freq: Four times a day (QID) | ORAL | Status: DC | PRN

## 2016-12-01 MED ORDER — ONDANSETRON HCL 4 MG PO TABS
4.0000 mg | ORAL_TABLET | Freq: Four times a day (QID) | ORAL | Status: DC | PRN
  Administered 2016-12-01 – 2016-12-03 (×2): 4 mg via ORAL
  Filled 2016-12-01 (×3): qty 1

## 2016-12-01 MED ORDER — ENOXAPARIN SODIUM 40 MG/0.4ML ~~LOC~~ SOLN
40.0000 mg | SUBCUTANEOUS | Status: DC
  Administered 2016-12-01: 40 mg via SUBCUTANEOUS
  Filled 2016-12-01 (×2): qty 0.4

## 2016-12-01 MED ORDER — HYDROMORPHONE HCL 1 MG/ML IJ SOLN
0.5000 mg | INTRAMUSCULAR | Status: DC | PRN
  Administered 2016-12-01 – 2016-12-02 (×6): 0.5 mg via INTRAVENOUS
  Filled 2016-12-01 (×6): qty 0.5

## 2016-12-01 MED ORDER — METOCLOPRAMIDE HCL 5 MG/ML IJ SOLN
10.0000 mg | Freq: Once | INTRAMUSCULAR | Status: AC
  Administered 2016-12-01: 20:00:00 10 mg via INTRAVENOUS
  Filled 2016-12-01: qty 2

## 2016-12-01 MED ORDER — OXYCODONE HCL 5 MG PO TABS: 10.0000 mg | ORAL_TABLET | ORAL | Status: DC | PRN

## 2016-12-01 MED ORDER — DEXTROSE 5 % IV SOLN
500.0000 mg | Freq: Every day | INTRAVENOUS | Status: DC
  Administered 2016-12-01 – 2016-12-02 (×2): 500 mg via INTRAVENOUS
  Filled 2016-12-01 (×4): qty 500

## 2016-12-01 MED ORDER — ALBUTEROL SULFATE (2.5 MG/3ML) 0.083% IN NEBU
2.5000 mg | INHALATION_SOLUTION | RESPIRATORY_TRACT | Status: DC | PRN
Start: 2016-12-01 — End: 2016-12-03

## 2016-12-01 MED ORDER — OXYCODONE HCL 5 MG PO TABS
5.0000 mg | ORAL_TABLET | ORAL | Status: DC | PRN
Start: 2016-12-01 — End: 2016-12-01
  Administered 2016-12-01: 11:00:00 5 mg via ORAL
  Filled 2016-12-01: qty 1

## 2016-12-01 MED ORDER — OXYCODONE HCL 5 MG PO TABS
5.0000 mg | ORAL_TABLET | ORAL | Status: DC | PRN
  Administered 2016-12-01 – 2016-12-03 (×6): 5 mg via ORAL
  Filled 2016-12-01 (×6): qty 1

## 2016-12-01 MED ORDER — ACETAMINOPHEN 650 MG RE SUPP
650.0000 mg | Freq: Four times a day (QID) | RECTAL | Status: DC | PRN
  Administered 2016-12-01: 20:00:00 650 mg via RECTAL
  Filled 2016-12-01: qty 1

## 2016-12-01 MED ORDER — POLYETHYLENE GLYCOL 3350 17 G PO PACK: 17.0000 g | PACK | Freq: Every day | ORAL | Status: DC | PRN

## 2016-12-01 MED ORDER — PROMETHAZINE HCL 25 MG/ML IJ SOLN
25.0000 mg | Freq: Four times a day (QID) | INTRAMUSCULAR | Status: DC | PRN
  Administered 2016-12-01 – 2016-12-02 (×3): 25 mg via INTRAVENOUS
  Filled 2016-12-01 (×3): qty 1

## 2016-12-01 MED ORDER — SODIUM CHLORIDE 0.9 % IV BOLUS (SEPSIS)
1000.0000 mL | Freq: Once | INTRAVENOUS | Status: AC
  Administered 2016-12-01: 12:00:00 1000 mL via INTRAVENOUS

## 2016-12-01 MED ORDER — SODIUM CHLORIDE 0.9 % IV SOLN
INTRAVENOUS | Status: DC
  Administered 2016-12-01 (×2): via INTRAVENOUS

## 2016-12-01 MED ORDER — CLINDAMYCIN PHOSPHATE 600 MG/50ML IV SOLN
600.0000 mg | Freq: Three times a day (TID) | INTRAVENOUS | Status: DC
  Administered 2016-12-01 – 2016-12-03 (×6): 600 mg via INTRAVENOUS
  Filled 2016-12-01 (×7): qty 50

## 2016-12-01 MED ORDER — MORPHINE SULFATE (PF) 2 MG/ML IV SOLN
2.0000 mg | INTRAVENOUS | Status: DC | PRN
  Administered 2016-12-01: 2 mg via INTRAVENOUS
  Filled 2016-12-01: qty 1

## 2016-12-01 MED ORDER — ONDANSETRON HCL 4 MG/2ML IJ SOLN
4.0000 mg | Freq: Four times a day (QID) | INTRAMUSCULAR | Status: DC | PRN
  Administered 2016-12-01 – 2016-12-02 (×2): 4 mg via INTRAVENOUS
  Filled 2016-12-01 (×2): qty 2

## 2016-12-01 NOTE — Progress Notes (Signed)
Called into room and pt reports that she has been vomiting continuously.  Last saw pt at 1910 and she was not vomiting.  Pt reports she threw up in the trash and not a blue measuring bag.  Unable to find emesis in the trash to observe amount, continent.  Phenergan was given at 1603 and Zofran was given at 1845.  Call out to [facility] . Dorna Bloom RN

## 2016-12-01 NOTE — Progress Notes (Signed)
Reported to Dr Darvin Neighbours that patient has a critical lactic acid of 2.5. MD acknowledged and ordered 1 L bolus.

## 2016-12-01 NOTE — Progress Notes (Signed)
Reported to Dr Darvin Neighbours by text that patient has a critical lactic acid of 2.0
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
## 2016-12-02 ENCOUNTER — Inpatient Hospital Stay: Payer: Medicaid Other

## 2016-12-02 LAB — BASIC METABOLIC PANEL
Anion gap: 6 (ref 5–15)
BUN: 8 mg/dL (ref 6–20)
CHLORIDE: 104 mmol/L (ref 101–111)
CO2: 26 mmol/L (ref 22–32)
CREATININE: 0.76 mg/dL (ref 0.44–1.00)
Calcium: 8.2 mg/dL — ABNORMAL LOW (ref 8.9–10.3)
GFR calc non Af Amer: >60 mL/min (ref >60)
Glucose, Bld: 109 mg/dL — ABNORMAL HIGH (ref 65–99)
POTASSIUM: 3.1 mmol/L — AB (ref 3.5–5.1)
SODIUM: 136 mmol/L (ref 135–145)

## 2016-12-02 LAB — CBC
HEMATOCRIT: 38.9 % (ref 35.0–47.0)
Hemoglobin: 13.5 g/dL (ref 12.0–16.0)
MCH: 31.2 pg (ref 26.0–34.0)
MCHC: 34.6 g/dL (ref 32.0–36.0)
MCV: 90.3 fL (ref 80.0–100.0)
PLATELETS: 217 10*3/uL (ref 150–440)
RBC: 4.31 MIL/uL (ref 3.80–5.20)
RDW: 13.7 % (ref 11.5–14.5)
WBC: 9.5 10*3/uL (ref 3.6–11.0)

## 2016-12-02 LAB — EXPECTORATED SPUTUM ASSESSMENT W GRAM STAIN, RFLX TO RESP C: Special Requests: NORMAL

## 2016-12-02 LAB — HIV ANTIBODY (ROUTINE TESTING W REFLEX): HIV SCREEN 4TH GENERATION: NONREACTIVE

## 2016-12-02 LAB — EXPECTORATED SPUTUM ASSESSMENT W REFEX TO RESP CULTURE

## 2016-12-02 MED ORDER — CLONAZEPAM 0.5 MG PO TABS
0.5000 mg | ORAL_TABLET | Freq: Every day | ORAL | Status: DC
  Administered 2016-12-02: 0.5 mg via ORAL
  Filled 2016-12-02: qty 1

## 2016-12-02 MED ORDER — CLONIDINE HCL 0.1 MG PO TABS
0.2000 mg | ORAL_TABLET | Freq: Every day | ORAL | Status: DC
  Administered 2016-12-02 – 2016-12-03 (×2): 0.2 mg via ORAL
  Filled 2016-12-02 (×2): qty 2

## 2016-12-02 MED ORDER — POTASSIUM CHLORIDE CRYS ER 20 MEQ PO TBCR
40.0000 meq | EXTENDED_RELEASE_TABLET | Freq: Two times a day (BID) | ORAL | Status: DC
  Administered 2016-12-02 (×2): 40 meq via ORAL
  Filled 2016-12-02 (×2): qty 2

## 2016-12-02 MED ORDER — SIMVASTATIN 20 MG PO TABS
20.0000 mg | ORAL_TABLET | Freq: Every day | ORAL | Status: DC
Start: 2016-12-02 — End: 2016-12-03
  Administered 2016-12-02: 17:00:00 20 mg via ORAL
  Filled 2016-12-02: qty 1

## 2016-12-02 MED ORDER — FAMOTIDINE 20 MG PO TABS
20.0000 mg | ORAL_TABLET | Freq: Two times a day (BID) | ORAL | Status: DC
  Administered 2016-12-02 – 2016-12-03 (×3): 20 mg via ORAL
  Filled 2016-12-02 (×3): qty 1

## 2016-12-02 MED ORDER — BUTALBITAL-APAP-CAFFEINE 50-325-40 MG PO TABS
1.0000 | ORAL_TABLET | Freq: Once | ORAL | Status: AC
  Administered 2016-12-02: 15:00:00 1 via ORAL
  Filled 2016-12-02: qty 1

## 2016-12-02 MED ORDER — LORATADINE 10 MG PO TABS
10.0000 mg | ORAL_TABLET | Freq: Every day | ORAL | Status: DC
  Administered 2016-12-02 – 2016-12-03 (×2): 10 mg via ORAL
  Filled 2016-12-02 (×2): qty 1

## 2016-12-02 MED ORDER — HYDROXYZINE HCL 25 MG PO TABS: 50.0000 mg | ORAL_TABLET | Freq: Three times a day (TID) | ORAL | Status: DC | PRN

## 2016-12-02 MED ORDER — SERTRALINE HCL 50 MG PO TABS
200.0000 mg | ORAL_TABLET | Freq: Every day | ORAL | Status: DC
  Administered 2016-12-02: 21:00:00 200 mg via ORAL
  Filled 2016-12-02: qty 4

## 2016-12-02 MED ORDER — MOMETASONE FURO-FORMOTEROL FUM 100-5 MCG/ACT IN AERO
2.0000 | INHALATION_SPRAY | Freq: Two times a day (BID) | RESPIRATORY_TRACT | Status: DC
  Administered 2016-12-02 – 2016-12-03 (×2): 2 via RESPIRATORY_TRACT
  Filled 2016-12-02: qty 8.8

## 2016-12-02 MED ORDER — CYCLOBENZAPRINE HCL 10 MG PO TABS
10.0000 mg | ORAL_TABLET | Freq: Three times a day (TID) | ORAL | Status: DC | PRN
  Administered 2016-12-02 – 2016-12-03 (×2): 10 mg via ORAL
  Filled 2016-12-02 (×2): qty 1

## 2016-12-02 MED ORDER — LUBIPROSTONE 24 MCG PO CAPS
24.0000 ug | ORAL_CAPSULE | Freq: Every day | ORAL | Status: DC
  Administered 2016-12-03: 24 ug via ORAL
  Filled 2016-12-02: qty 1

## 2016-12-02 MED ORDER — ALBUTEROL SULFATE HFA 108 (90 BASE) MCG/ACT IN AERS: 2.0000 | INHALATION_SPRAY | Freq: Four times a day (QID) | RESPIRATORY_TRACT | Status: DC | PRN

## 2016-12-02 MED ORDER — POLYETHYLENE GLYCOL 3350 17 G PO PACK: 17.0000 g | PACK | Freq: Every day | ORAL | Status: DC | PRN

## 2016-12-02 MED ORDER — BUSPIRONE HCL 5 MG PO TABS
5.0000 mg | ORAL_TABLET | Freq: Two times a day (BID) | ORAL | Status: DC
  Filled 2016-12-02 (×4): qty 1

## 2016-12-02 MED ORDER — MONTELUKAST SODIUM 10 MG PO TABS
10.0000 mg | ORAL_TABLET | Freq: Every day | ORAL | Status: DC
  Administered 2016-12-02: 21:00:00 10 mg via ORAL
  Filled 2016-12-02: qty 1

## 2016-12-02 NOTE — Progress Notes (Signed)
[name] at [place] NAME: [name]    MR#:  [id number]  DATE OF BIRTH:  1970-08-25  SUBJECTIVE:  CHIEF COMPLAINT:  No chief complaint on file.    Patient is complaining of significant regurgitation for last few months, supposed to see GI in clinic next week.    Went to have her regular checkup with her primary care physician and noted to have fever, sent to emergency room for further evaluation and admitted for aspiration pneumonia. She continued to have some nausea and headache and complaining of pain all over her body. She has a chronic pain medication use.  REVIEW OF SYSTEMS:  CONSTITUTIONAL: No fever, fatigue or weakness.  EYES: No blurred or double vision.  EARS, NOSE, AND THROAT: No tinnitus or ear pain.  RESPIRATORY: No cough, shortness of breath, wheezing or hemoptysis.  CARDIOVASCULAR: No chest pain, orthopnea, edema.  GASTROINTESTINAL: Positive for  nausea, vomiting,  no diarrhea or abdominal pain.  GENITOURINARY: No dysuria, hematuria.  ENDOCRINE: No polyuria, nocturia,  HEMATOLOGY: No anemia, easy bruising or bleeding SKIN: No rash or lesion. MUSCULOSKELETAL: No joint pain or arthritis.   NEUROLOGIC: No tingling, numbness, weakness.  PSYCHIATRY: No anxiety or depression.   ROS  DRUG ALLERGIES:   Allergies  Allergen Reactions  . Fluoxetine Hives  . Aspirin   . Lamotrigine   . Pregabalin Other (See Comments)  . Ibuprofen Rash and Other (See Comments)    THROAT SPASMS  . Iodinated Diagnostic Agents Rash  . Penicillins Rash    PT DENIES THIS ALLERGY DURING PHONE INTERVIEW (08-02-15) AND STATES THAT SHE HAS HAD PCN AND HAD NO PROBLEMS   . Shrimp [Shellfish Allergy] Swelling and Rash    VITALS:  Blood pressure 100/61, pulse 91, temperature 98.1 F (36.7 C), temperature source Oral, resp. rate 18, height 5\' 3"  (1.6 m), weight 95.3 kg (210 lb), last menstrual period 09/14/2011, SpO2 98 %.  PHYSICAL EXAMINATION:   GENERAL:  46 y.o.-year-old patient lying in the bed with no acute distress.  EYES: Pupils equal, round, reactive to light and accommodation. No scleral icterus. Extraocular muscles intact.  HEENT: Head atraumatic, normocephalic. Oropharynx and nasopharynx clear.  NECK:  Supple, no jugular venous distention. No thyroid enlargement, no tenderness.  LUNGS: Normal breath sounds bilaterally, no wheezing,Scattered right-sided  crepitation. No use of accessory muscles of respiration.  CARDIOVASCULAR: S1, S2 normal. No murmurs, rubs, or gallops.  ABDOMEN: Soft, nontender, nondistended. Bowel sounds present. No organomegaly or mass.  EXTREMITIES: No pedal edema, cyanosis, or clubbing.  NEUROLOGIC: Cranial nerves II through XII are intact. Muscle strength 5/5 in all extremities. Sensation intact. Gait not checked.  PSYCHIATRIC: The patient is alert and oriented x 3.  SKIN: No obvious rash, lesion, or ulcer.   Physical Exam LABORATORY PANEL:   CBC  Recent Labs Lab 12/02/16 0757  WBC 9.5  HGB 13.5  HCT 38.9  PLT 217   ------------------------------------------------------------------------------------------------------------------  Chemistries   Recent Labs Lab 12/01/16 1127 12/02/16 0757  NA 138 136  K 3.3* 3.1*  CL 105 104  CO2 26 26  GLUCOSE 99 109*  BUN 6 8  CREATININE 0.69 0.76  CALCIUM 9.1 8.2*  AST 47*  --   ALT 51  --   ALKPHOS 79  --   BILITOT 0.6  --    ------------------------------------------------------------------------------------------------------------------  Cardiac Enzymes No results for input(s): TROPONINI in the last 168 hours. ------------------------------------------------------------------------------------------------------------------  RADIOLOGY:  Dg Chest 2 View  Result Date:  12/02/2016 CLINICAL DATA:  Aspiration EXAM: CHEST  2 VIEW COMPARISON:  May 01, 2011 FINDINGS: There is a nodular opacity in the right mid lung measuring 1.1 x 0.8  cm. Lungs elsewhere are clear. Heart size and pulmonary vascularity are normal. No adenopathy. Surgical clips are noted on the right. There is a small hiatal hernia. IMPRESSION: Nodular opacity right mid lung. Advise noncontrast enhanced chest CT to further assess. Lungs elsewhere clear. Small hiatal hernia. No adenopathy. These results will be called to the ordering clinician or representative by the Radiologist Assistant, and communication documented in the PACS or zVision Dashboard. Electronically Signed   By: Lowella Grip III M.D.   On: 12/02/2016 07:25   Ct Chest Wo Contrast  Result Date: 12/02/2016 CLINICAL DATA:  Nodular opacity right mid lung. EXAM: CT CHEST WITHOUT CONTRAST TECHNIQUE: Multidetector CT imaging of the chest was performed following the standard protocol without IV contrast. COMPARISON:  Chest x-ray 12/02/2016.  Chest CT 05/11/2007 FINDINGS: Cardiovascular: Heart is normal size. Aorta is normal caliber. Mediastinum/Nodes: No mediastinal, hilar, or axillary adenopathy. Moderate-sized hiatal hernia. Lungs/Pleura: There are numerous ground-glass peribronchovascular and tree-in-bud nodules throughout much of the right lung involving all lobes. The nodule seen on prior chest x-ray represents 1 of the larger mixed density nodule seen on image 72 measuring 9 mm. Given the diffuse nature of this throughout the right lung, this is most likely infectious/inflammatory. Left lung is clear. No effusions. Upper Abdomen: Imaging into the upper abdomen shows no acute findings. Mild fatty infiltration of the liver. Musculoskeletal: Bilateral breast implants noted. Chest wall soft tissues otherwise unremarkable. No acute bony abnormality. IMPRESSION: Extensive ground-glass nodular densities throughout much of the right lung. This is concerning for infectious/inflammatory, possibly acute pneumonia. Fatty liver. Electronically Signed   By: Rolm Baptise M.D.   On: 12/02/2016 09:44    ASSESSMENT AND PLAN:    Active Problems:   Fever  * Aspiration pneumonia with sepsis On IV clindamycin and azithromycin Blood and sputum cx. CBC, CMP, Lactic acid x2 Q3 hrs Repeat chest x-ray in the morning- showed some nodule. CT of the chest was done which showed diffuse groundglass opacity on the right side of the lung concerned with pneumonia.  * GERD with severe regurgitation Has GI f/u with Dr. Vira Agar 12/07/2016 PPI. Nausea meds  * Fibromyalgia Continue home meds along with oxycodone  She is complaining of excessive pain and asking for Dilaudid recurrent, I encouraged to try oral medication first.  * DVT prophylaxis Lovenox after CBC and BMP available.  * Hypokalemia   Replace orally.   All the records are reviewed and case discussed with Care Management/Social Workerr. Management plans discussed with the patient, family and they are in agreement.  CODE STATUS: Full code.  TOTAL TIME TAKING CARE OF THIS PATIENT: 35 minutes.     POSSIBLE D/C IN 1-2 DAYS, DEPENDING ON CLINICAL CONDITION.   Vaughan Basta M.D on 12/02/2016   Between 7am to 6pm - Pager - [phone]  After 6pm go to [email or web address] - password [email or web address] [place] [facility]  Office  [phone]  CC: Primary care physician; [name], MD  Note: This dictation was prepared with Dragon dictation along with smaller phrase technology. Any transcriptional errors that result from this process are unintentional.

## 2016-12-02 NOTE — Care Management Note (Signed)
Case Management Note  Patient Details  Name: [name] MRN: [id number] Date of Birth: 28-Oct-1970  Subjective/Objective:             Direct admit from pcp.  Independent in all adls, denies issues accessing medical care, obtaining medications or with transportation.  Current with her PCP.  Admitted with fever and n/v  Action/Plan:   Expected Discharge Date:                  Expected Discharge Plan:     In-House Referral:     Discharge planning Services     Post Acute Care Choice:    Choice offered to:     DME Arranged:    DME Agency:     HH Arranged:    HH Agency:     Status of Service:     If discussed at [employer], dates discussed:    Additional Comments:  Katrina Stack, RN 12/02/2016, 8:13 AM

## 2016-12-02 NOTE — Progress Notes (Signed)
This nurse received a call from [facility] about patient's stat chest x-ray this morning.  Results were given to Dr. Anselm Jungling whom was present on the unit.  Will await orders.  Clarise Cruz, RN

## 2016-12-03 LAB — BASIC METABOLIC PANEL
Anion gap: 7 (ref 5–15)
BUN: 7 mg/dL (ref 6–20)
CALCIUM: 8.5 mg/dL — AB (ref 8.9–10.3)
CHLORIDE: 108 mmol/L (ref 101–111)
CO2: 24 mmol/L (ref 22–32)
Creatinine, Ser: 0.81 mg/dL (ref 0.44–1.00)
GFR calc Af Amer: >60 mL/min (ref >60)
GLUCOSE: 106 mg/dL — AB (ref 65–99)
POTASSIUM: 4.9 mmol/L (ref 3.5–5.1)
SODIUM: 139 mmol/L (ref 135–145)

## 2016-12-03 MED ORDER — SODIUM CHLORIDE 0.9 % IV SOLN
1.5000 g | Freq: Four times a day (QID) | INTRAVENOUS | Status: DC
  Filled 2016-12-03 (×3): qty 1.5

## 2016-12-03 MED ORDER — ALUM & MAG HYDROXIDE-SIMETH 200-200-20 MG/5ML PO SUSP
15.0000 mL | ORAL | Status: DC | PRN
  Administered 2016-12-03: 15 mL via ORAL
  Filled 2016-12-03: qty 30

## 2016-12-03 MED ORDER — AMOXICILLIN-POT CLAVULANATE 875-125 MG PO TABS: 1.0000 | ORAL_TABLET | Freq: Two times a day (BID) | ORAL | Status: DC

## 2016-12-03 MED ORDER — AMOXICILLIN-POT CLAVULANATE 875-125 MG PO TABS: 1.0000 | ORAL_TABLET | Freq: Two times a day (BID) | ORAL | 0 refills | Status: AC

## 2016-12-03 MED ORDER — BUTALBITAL-APAP-CAFFEINE 50-325-40 MG PO TABS
1.0000 | ORAL_TABLET | Freq: Four times a day (QID) | ORAL | Status: DC | PRN
  Administered 2016-12-03: 1 via ORAL
  Filled 2016-12-03: qty 1

## 2016-12-03 MED ORDER — BUTALBITAL-APAP-CAFFEINE 50-325-40 MG PO TABS: 1.0000 | ORAL_TABLET | Freq: Four times a day (QID) | ORAL | 0 refills | Status: DC | PRN

## 2016-12-03 MED ORDER — PANTOPRAZOLE SODIUM 40 MG PO TBEC: 40.0000 mg | DELAYED_RELEASE_TABLET | Freq: Two times a day (BID) | ORAL | 0 refills | Status: DC

## 2016-12-03 MED ORDER — BUSPIRONE HCL 5 MG PO TABS: 5.0000 mg | ORAL_TABLET | Freq: Two times a day (BID) | ORAL | 0 refills | Status: DC

## 2016-12-03 MED ORDER — AMOXICILLIN-POT CLAVULANATE 875-125 MG PO TABS
1.0000 | ORAL_TABLET | Freq: Two times a day (BID) | ORAL | Status: DC
  Administered 2016-12-03: 12:00:00 1 via ORAL
  Filled 2016-12-03: qty 1

## 2016-12-03 MED ORDER — PROMETHAZINE HCL 12.5 MG PO TABS: 50.0000 mg | ORAL_TABLET | Freq: Four times a day (QID) | ORAL | 0 refills | Status: DC | PRN

## 2016-12-03 NOTE — Care Management (Signed)
No discharge needs identified by members of the care team 

## 2016-12-03 NOTE — Progress Notes (Deleted)
Text message to Dr Marthann Schiller that prior to admission orthopedic MD gave pt RX for methocarbamol 500mg  tab every 6hrs PO. Pt wants to know if she can take it. Awaiting for response.

## 2016-12-03 NOTE — Progress Notes (Signed)
Pt has been discharged home. Discharge papers given and explained to pt. Pt verbalized understanding. Meds and f/u appointments reviewed with pt. RX given.  
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
## 2016-12-04 LAB — CULTURE, RESPIRATORY W GRAM STAIN: Special Requests: NORMAL

## 2016-12-04 LAB — CULTURE, RESPIRATORY: CULTURE: NORMAL

## 2016-12-06 LAB — CULTURE, BLOOD (ROUTINE X 2)
Culture: NO GROWTH
Culture: NO GROWTH

## 2016-12-18 ENCOUNTER — Other Ambulatory Visit: Payer: Self-pay | Admitting: Nurse Practitioner

## 2016-12-18 DIAGNOSIS — K222 Esophageal obstruction: Secondary | ICD-10-CM

## 2016-12-18 DIAGNOSIS — K219 Gastro-esophageal reflux disease without esophagitis: Secondary | ICD-10-CM

## 2016-12-22 ENCOUNTER — Ambulatory Visit
Admission: RE | Admit: 2016-12-22 | Discharge: 2016-12-22 | Disposition: A | Discharge status: Home / Self Care | Payer: Medicaid Other | Source: Ambulatory Visit | Attending: Nurse Practitioner | Admitting: Nurse Practitioner

## 2016-12-22 DIAGNOSIS — K222 Esophageal obstruction: Secondary | ICD-10-CM | POA: Diagnosis present

## 2016-12-22 DIAGNOSIS — K219 Gastro-esophageal reflux disease without esophagitis: Secondary | ICD-10-CM

## 2016-12-22 DIAGNOSIS — K449 Diaphragmatic hernia without obstruction or gangrene: Secondary | ICD-10-CM | POA: Diagnosis not present

## 2016-12-24 ENCOUNTER — Encounter: Payer: Self-pay | Admitting: Urology

## 2016-12-24 ENCOUNTER — Telehealth: Payer: Self-pay | Admitting: Radiology

## 2016-12-24 ENCOUNTER — Other Ambulatory Visit: Payer: Self-pay | Admitting: Radiology

## 2016-12-24 ENCOUNTER — Ambulatory Visit: Payer: Medicaid Other | Admitting: Urology

## 2016-12-24 VITALS — BP 123/89 | HR 93 | Ht 63.0 in | Wt 195.0 lb

## 2016-12-24 DIAGNOSIS — N3010 Interstitial cystitis (chronic) without hematuria: Secondary | ICD-10-CM

## 2016-12-24 DIAGNOSIS — R300 Dysuria: Secondary | ICD-10-CM

## 2016-12-24 DIAGNOSIS — N390 Urinary tract infection, site not specified: Secondary | ICD-10-CM

## 2016-12-24 LAB — URINALYSIS, COMPLETE
BILIRUBIN UA: NEGATIVE
GLUCOSE, UA: NEGATIVE
Ketones, UA: NEGATIVE
NITRITE UA: NEGATIVE
PROTEIN UA: NEGATIVE
RBC UA: NEGATIVE
Specific Gravity, UA: 1.025 (ref 1.005–1.030)
UUROB: 0.2 mg/dL (ref 0.2–1.0)
pH, UA: 5.5 (ref 5.0–7.5)

## 2016-12-24 LAB — MICROSCOPIC EXAMINATION: Bacteria, UA: NONE SEEN
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
## 2016-12-24 NOTE — Telephone Encounter (Signed)
LMOM to notify pt of pre-admit testing appt on 12/29/16 @2 :00. Requested return call to confirm surgery information.

## 2016-12-25 NOTE — Telephone Encounter (Signed)
Confirmed surgery scheduled with Dr Erlene Quan on 01/05/17 pending clearance from Dr Raul Del & pre-admit testing appt on 12/29/16 @2 :00. Pt had no further questions at this time & voices understanding.

## 2016-12-27 LAB — CULTURE, URINE COMPREHENSIVE

## 2016-12-29 ENCOUNTER — Inpatient Hospital Stay: Admission: RE | Admit: 2016-12-29 | Payer: Medicaid Other | Source: Ambulatory Visit

## 2016-12-30 ENCOUNTER — Other Ambulatory Visit: Payer: Self-pay | Admitting: Specialist

## 2016-12-30 ENCOUNTER — Encounter: Payer: Self-pay | Admitting: Urology

## 2016-12-30 DIAGNOSIS — R918 Other nonspecific abnormal finding of lung field: Secondary | ICD-10-CM

## 2016-12-30 NOTE — Telephone Encounter (Signed)
Pt would like to discuss surgery with Dr Raul Del prior to making the decision to cancel vs. Reschedule. Pt will return call after appt with Dr Raul Del.

## 2017-01-04 ENCOUNTER — Encounter: Admission: RE | Payer: Self-pay | Source: Ambulatory Visit

## 2017-01-04 ENCOUNTER — Ambulatory Visit
Admission: RE | Admit: 2017-01-04 | Payer: Medicaid Other | Source: Ambulatory Visit | Admitting: Unknown Physician Specialty

## 2017-01-04 SURGERY — ESOPHAGOGASTRODUODENOSCOPY (EGD) WITH PROPOFOL
Anesthesia: General

## 2017-01-26 ENCOUNTER — Encounter: Admission: RE | Payer: Self-pay | Source: Ambulatory Visit

## 2017-01-26 ENCOUNTER — Ambulatory Visit: Admission: RE | Admit: 2017-01-26 | Payer: Medicaid Other | Source: Ambulatory Visit | Admitting: Urology

## 2017-01-26 SURGERY — CYSTOSCOPY, WITH BLADDER HYDRODISTENSION
Anesthesia: Choice

## 2017-02-01 ENCOUNTER — Ambulatory Visit: Payer: Medicaid Other

## 2017-02-02 ENCOUNTER — Ambulatory Visit
Admission: RE | Admit: 2017-02-02 | Discharge: 2017-02-02 | Disposition: A | Discharge status: Home / Self Care | Payer: Medicaid Other | Source: Ambulatory Visit | Attending: Specialist | Admitting: Specialist

## 2017-02-02 DIAGNOSIS — R918 Other nonspecific abnormal finding of lung field: Secondary | ICD-10-CM

## 2017-02-02 DIAGNOSIS — K449 Diaphragmatic hernia without obstruction or gangrene: Secondary | ICD-10-CM | POA: Diagnosis not present

## 2017-05-26 ENCOUNTER — Encounter: Payer: Self-pay | Admitting: Intensive Care

## 2017-05-26 ENCOUNTER — Emergency Department
Admission: EM | Admit: 2017-05-26 | Discharge: 2017-05-26 | Disposition: A | Discharge status: Home / Self Care | Payer: Medicaid Other | Attending: Emergency Medicine | Admitting: Emergency Medicine

## 2017-05-26 DIAGNOSIS — Z79899 Other long term (current) drug therapy: Secondary | ICD-10-CM | POA: Insufficient documentation

## 2017-05-26 DIAGNOSIS — R1011 Right upper quadrant pain: Secondary | ICD-10-CM | POA: Diagnosis not present

## 2017-05-26 DIAGNOSIS — M797 Fibromyalgia: Secondary | ICD-10-CM | POA: Diagnosis not present

## 2017-05-26 DIAGNOSIS — Z87891 Personal history of nicotine dependence: Secondary | ICD-10-CM | POA: Diagnosis not present

## 2017-05-26 DIAGNOSIS — J45909 Unspecified asthma, uncomplicated: Secondary | ICD-10-CM | POA: Insufficient documentation

## 2017-05-26 DIAGNOSIS — M79603 Pain in arm, unspecified: Secondary | ICD-10-CM | POA: Diagnosis present

## 2017-05-26 DIAGNOSIS — M791 Myalgia, unspecified site: Secondary | ICD-10-CM

## 2017-05-26 LAB — COMPREHENSIVE METABOLIC PANEL
ALT: 47 U/L (ref 14–54)
AST: 39 U/L (ref 15–41)
Albumin: 4.2 g/dL (ref 3.5–5.0)
Alkaline Phosphatase: 83 U/L (ref 38–126)
Anion gap: 10 (ref 5–15)
BUN: 7 mg/dL (ref 6–20)
CHLORIDE: 102 mmol/L (ref 101–111)
CO2: 26 mmol/L (ref 22–32)
CREATININE: 0.82 mg/dL (ref 0.44–1.00)
Calcium: 9.2 mg/dL (ref 8.9–10.3)
GFR calc Af Amer: >60 mL/min (ref >60)
Glucose, Bld: 95 mg/dL (ref 65–99)
Potassium: 3.6 mmol/L (ref 3.5–5.1)
SODIUM: 138 mmol/L (ref 135–145)
Total Bilirubin: 0.9 mg/dL (ref 0.3–1.2)
Total Protein: 7.7 g/dL (ref 6.5–8.1)

## 2017-05-26 LAB — CBC
HCT: 42.4 % (ref 35.0–47.0)
Hemoglobin: 14.1 g/dL (ref 12.0–16.0)
MCH: 29.6 pg (ref 26.0–34.0)
MCHC: 33.3 g/dL (ref 32.0–36.0)
MCV: 88.9 fL (ref 80.0–100.0)
PLATELETS: 246 10*3/uL (ref 150–440)
RBC: 4.78 MIL/uL (ref 3.80–5.20)
RDW: 13.4 % (ref 11.5–14.5)
WBC: 12.7 10*3/uL — ABNORMAL HIGH (ref 3.6–11.0)

## 2017-05-26 LAB — CK: Total CK: 133 U/L (ref 38–234)

## 2017-05-26 LAB — LIPASE, BLOOD: LIPASE: 17 U/L (ref 11–51)

## 2017-05-26 MED ORDER — SODIUM CHLORIDE 0.9 % IV BOLUS (SEPSIS)
1000.0000 mL | Freq: Once | INTRAVENOUS | Status: AC
  Administered 2017-05-26: 1000 mL via INTRAVENOUS

## 2017-05-26 MED ORDER — DIAZEPAM 5 MG PO TABS
5.0000 mg | ORAL_TABLET | Freq: Once | ORAL | Status: AC
  Administered 2017-05-26: 5 mg via ORAL
  Filled 2017-05-26: qty 1

## 2017-05-26 MED ORDER — ACETAMINOPHEN 500 MG PO TABS
1000.0000 mg | ORAL_TABLET | Freq: Once | ORAL | Status: AC
  Administered 2017-05-26: 1000 mg via ORAL
  Filled 2017-05-26: qty 2

## 2017-05-26 MED ORDER — OXYCODONE HCL 5 MG PO TABS
5.0000 mg | ORAL_TABLET | Freq: Once | ORAL | Status: AC
  Administered 2017-05-26: 5 mg via ORAL
  Filled 2017-05-26: qty 1

## 2017-05-26 MED ORDER — METHOCARBAMOL 500 MG PO TABS: 500.0000 mg | ORAL_TABLET | Freq: Four times a day (QID) | ORAL | 0 refills | Status: DC
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
## 2017-05-28 ENCOUNTER — Inpatient Hospital Stay: Payer: Medicaid Other

## 2017-05-28 ENCOUNTER — Inpatient Hospital Stay: Payer: Medicaid Other | Admitting: Hematology and Oncology
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
## 2017-06-03 ENCOUNTER — Inpatient Hospital Stay: Payer: Medicaid Other | Admitting: Hematology and Oncology

## 2017-06-03 ENCOUNTER — Inpatient Hospital Stay: Payer: Medicaid Other
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
## 2017-06-10 ENCOUNTER — Encounter: Payer: Self-pay | Admitting: Hematology and Oncology

## 2017-06-10 ENCOUNTER — Other Ambulatory Visit: Payer: Self-pay | Admitting: *Deleted

## 2017-06-10 ENCOUNTER — Inpatient Hospital Stay: Payer: Medicaid Other | Attending: Hematology and Oncology

## 2017-06-10 ENCOUNTER — Inpatient Hospital Stay (HOSPITAL_BASED_OUTPATIENT_CLINIC_OR_DEPARTMENT_OTHER): Payer: Medicaid Other | Admitting: Hematology and Oncology

## 2017-06-10 VITALS — BP 113/80 | HR 93 | Temp 98.3°F | Wt 191.3 lb

## 2017-06-10 DIAGNOSIS — Z8042 Family history of malignant neoplasm of prostate: Secondary | ICD-10-CM

## 2017-06-10 DIAGNOSIS — M797 Fibromyalgia: Secondary | ICD-10-CM

## 2017-06-10 DIAGNOSIS — Z87891 Personal history of nicotine dependence: Secondary | ICD-10-CM | POA: Diagnosis not present

## 2017-06-10 DIAGNOSIS — Z8049 Family history of malignant neoplasm of other genital organs: Secondary | ICD-10-CM

## 2017-06-10 DIAGNOSIS — Z90710 Acquired absence of both cervix and uterus: Secondary | ICD-10-CM | POA: Insufficient documentation

## 2017-06-10 DIAGNOSIS — Z171 Estrogen receptor negative status [ER-]: Secondary | ICD-10-CM | POA: Insufficient documentation

## 2017-06-10 DIAGNOSIS — Z8051 Family history of malignant neoplasm of kidney: Secondary | ICD-10-CM

## 2017-06-10 DIAGNOSIS — D0511 Intraductal carcinoma in situ of right breast: Secondary | ICD-10-CM

## 2017-06-10 DIAGNOSIS — Z880 Allergy status to penicillin: Secondary | ICD-10-CM

## 2017-06-10 DIAGNOSIS — Z85828 Personal history of other malignant neoplasm of skin: Secondary | ICD-10-CM | POA: Insufficient documentation

## 2017-06-10 DIAGNOSIS — Z9013 Acquired absence of bilateral breasts and nipples: Secondary | ICD-10-CM | POA: Diagnosis not present

## 2017-06-10 DIAGNOSIS — Z8701 Personal history of pneumonia (recurrent): Secondary | ICD-10-CM | POA: Diagnosis not present

## 2017-06-10 DIAGNOSIS — Z90722 Acquired absence of ovaries, bilateral: Secondary | ICD-10-CM | POA: Insufficient documentation

## 2017-06-10 DIAGNOSIS — K219 Gastro-esophageal reflux disease without esophagitis: Secondary | ICD-10-CM | POA: Insufficient documentation

## 2017-06-10 DIAGNOSIS — Z79899 Other long term (current) drug therapy: Secondary | ICD-10-CM | POA: Insufficient documentation

## 2017-06-10 DIAGNOSIS — K589 Irritable bowel syndrome without diarrhea: Secondary | ICD-10-CM | POA: Insufficient documentation

## 2017-06-10 DIAGNOSIS — Z8614 Personal history of Methicillin resistant Staphylococcus aureus infection: Secondary | ICD-10-CM | POA: Insufficient documentation

## 2017-06-10 DIAGNOSIS — J690 Pneumonitis due to inhalation of food and vomit: Secondary | ICD-10-CM

## 2017-06-10 DIAGNOSIS — C50911 Malignant neoplasm of unspecified site of right female breast: Secondary | ICD-10-CM

## 2017-06-10 DIAGNOSIS — Z86000 Personal history of in-situ neoplasm of breast: Secondary | ICD-10-CM | POA: Diagnosis present

## 2017-06-10 DIAGNOSIS — Z923 Personal history of irradiation: Secondary | ICD-10-CM

## 2017-06-10 DIAGNOSIS — Z801 Family history of malignant neoplasm of trachea, bronchus and lung: Secondary | ICD-10-CM | POA: Diagnosis not present

## 2017-06-10 DIAGNOSIS — Z853 Personal history of malignant neoplasm of breast: Secondary | ICD-10-CM

## 2017-06-10 LAB — CBC WITH DIFFERENTIAL/PLATELET
Basophils Absolute: 0.1 10*3/uL (ref 0–0.1)
Basophils Relative: 1 %
Eosinophils Absolute: 0.3 10*3/uL (ref 0–0.7)
Eosinophils Relative: 3 %
HCT: 40.9 % (ref 35.0–47.0)
Hemoglobin: 14 g/dL (ref 12.0–16.0)
Lymphocytes Relative: 21 %
Lymphs Abs: 1.7 10*3/uL (ref 1.0–3.6)
MCH: 30.2 pg (ref 26.0–34.0)
MCHC: 34.3 g/dL (ref 32.0–36.0)
MCV: 87.9 fL (ref 80.0–100.0)
Monocytes Absolute: 0.4 10*3/uL (ref 0.2–0.9)
Monocytes Relative: 5 %
Neutro Abs: 5.9 10*3/uL (ref 1.4–6.5)
Neutrophils Relative %: 70 %
Platelets: 353 10*3/uL (ref 150–440)
RBC: 4.65 MIL/uL (ref 3.80–5.20)
RDW: 13 % (ref 11.5–14.5)
WBC: 8.4 10*3/uL (ref 3.6–11.0)

## 2017-06-10 LAB — COMPREHENSIVE METABOLIC PANEL
ALT: 43 U/L (ref 14–54)
AST: 50 U/L — ABNORMAL HIGH (ref 15–41)
Albumin: 4.3 g/dL (ref 3.5–5.0)
Alkaline Phosphatase: 89 U/L (ref 38–126)
Anion gap: 11 (ref 5–15)
BUN: 9 mg/dL (ref 6–20)
CO2: 25 mmol/L (ref 22–32)
Calcium: 9.5 mg/dL (ref 8.9–10.3)
Chloride: 102 mmol/L (ref 101–111)
Creatinine, Ser: 0.87 mg/dL (ref 0.44–1.00)
GFR calc Af Amer: >60 mL/min (ref >60)
GFR calc non Af Amer: >60 mL/min (ref >60)
Glucose, Bld: 112 mg/dL — ABNORMAL HIGH (ref 65–99)
Potassium: 3.5 mmol/L (ref 3.5–5.1)
Sodium: 138 mmol/L (ref 135–145)
Total Bilirubin: 0.6 mg/dL (ref 0.3–1.2)
Total Protein: 7.8 g/dL (ref 6.5–8.1)

## 2017-06-10 NOTE — Progress Notes (Signed)
In today for 6 month follow up, breast cancer.  Pt reports having continued issues with bronchitis since hospitalized for pneumonia in April. Denies any concerns related to breast cancer.
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
## 2017-06-11 LAB — CANCER ANTIGEN 27.29: CA 27.29: 19 U/mL (ref 0.0–38.6)

## 2017-07-06 ENCOUNTER — Other Ambulatory Visit: Payer: Self-pay | Admitting: Neurology

## 2017-07-06 DIAGNOSIS — G43119 Migraine with aura, intractable, without status migrainosus: Secondary | ICD-10-CM

## 2017-07-13 ENCOUNTER — Ambulatory Visit: Payer: Medicaid Other

## 2017-07-23 ENCOUNTER — Ambulatory Visit
Admission: RE | Admit: 2017-07-23 | Discharge: 2017-07-23 | Disposition: A | Discharge status: Home / Self Care | Payer: Medicaid Other | Source: Ambulatory Visit | Attending: Neurology | Admitting: Neurology

## 2017-07-23 DIAGNOSIS — M792 Neuralgia and neuritis, unspecified: Secondary | ICD-10-CM | POA: Diagnosis present

## 2017-07-23 DIAGNOSIS — G43119 Migraine with aura, intractable, without status migrainosus: Secondary | ICD-10-CM | POA: Diagnosis present

## 2017-07-23 DIAGNOSIS — R200 Anesthesia of skin: Secondary | ICD-10-CM | POA: Insufficient documentation

## 2017-07-23 DIAGNOSIS — R202 Paresthesia of skin: Secondary | ICD-10-CM | POA: Insufficient documentation

## 2017-07-23 MED ORDER — GADOBENATE DIMEGLUMINE 529 MG/ML IV SOLN
18.0000 mL | Freq: Once | INTRAVENOUS | Status: AC | PRN
  Administered 2017-07-23: 18 mL via INTRAVENOUS

## 2017-10-04 ENCOUNTER — Telehealth: Payer: Self-pay | Admitting: *Deleted

## 2017-10-04 NOTE — Telephone Encounter (Signed)
Returned call and left message for her to call me back

## 2017-10-04 NOTE — Telephone Encounter (Signed)
I attempted to call patient again and got voice mail left message for patient to contact plastic surgeon

## 2017-10-04 NOTE — Telephone Encounter (Signed)
Patient called and reports that her breast implants are hurting and she is not sure of she should call her plastic surgeon or [place] or her PCP, because she has [employer] insurance.Please advise

## 2017-10-04 NOTE — Telephone Encounter (Signed)
  Patient should call her plastic surgeon.  M

## 2017-10-12 IMAGING — RF DG UGI W/ HIGH DENSITY W/O KUB
14 of 18 series · 14 of 18 positions shown · non-contrast
Comparison: 02/18/2016

CLINICAL DATA: Worsening GERD over the past 7 months.

EXAM:
UPPER GI SERIES WITHOUT KUB
TECHNIQUE: Routine upper GI series was performed with thin and thick barium.
FLUOROSCOPY TIME:  Fluoroscopy Time:  0.7 minutes
Radiation Exposure Index (if provided by the fluoroscopic device):
8.3 mGy
Number of Acquired Spot Images: 0

[Series 1: cp_standard · 0.26mm/px · 1 of 1 slices shown (1 of 14)]
[im 1/1]
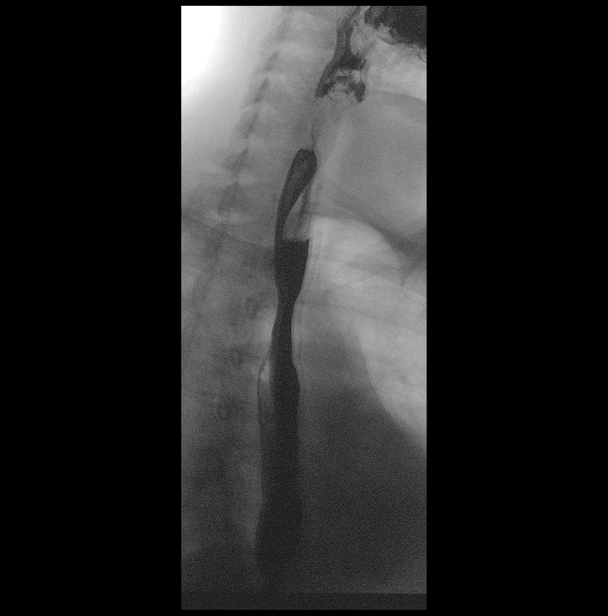

[Series 2: cp_standard · 0.26mm/px · 1 of 1 slices shown (2 of 14)]
[im 1/1]
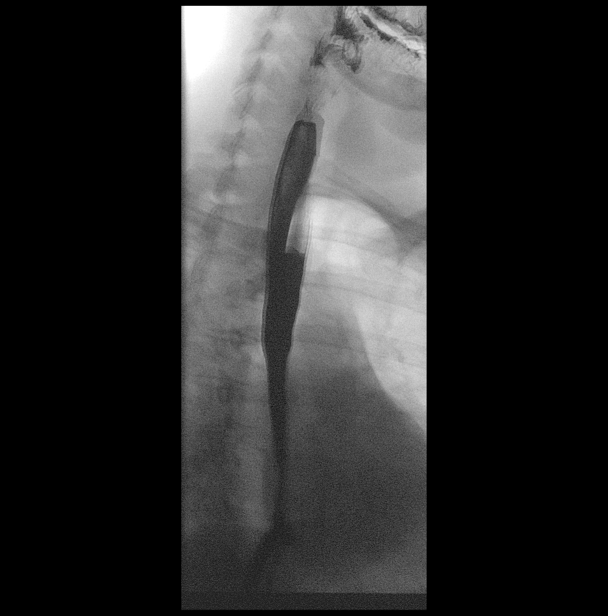

[Series 4: cp_standard · 0.26mm/px · 1 of 1 slices shown (3 of 14)]
[im 1/1]
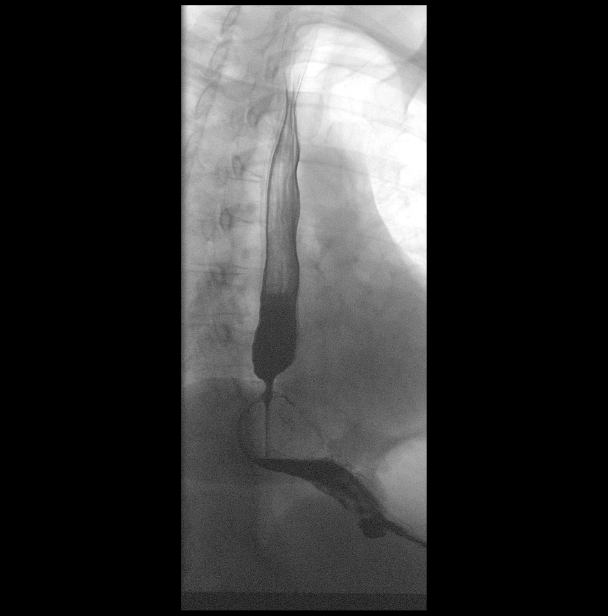

[Series 5: cp_standard · 0.26mm/px · 1 of 1 slices shown (4 of 14)]
[im 1/1]
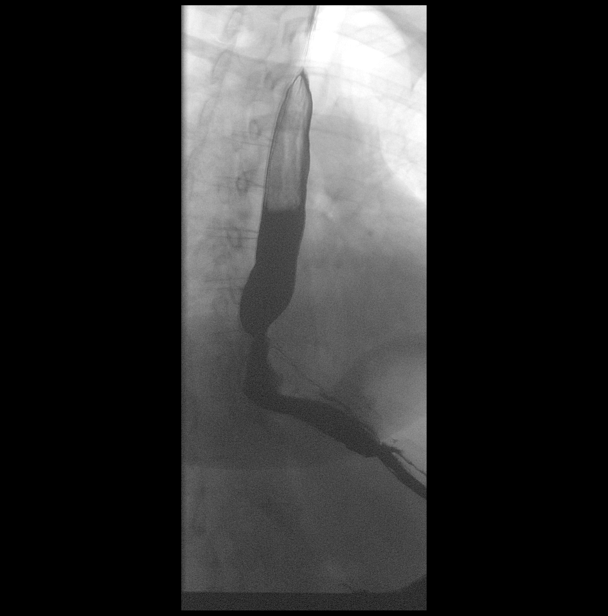

[Series 6: cp_standard · 0.26mm/px · 1 of 1 slices shown (5 of 14)]
[im 1/1]
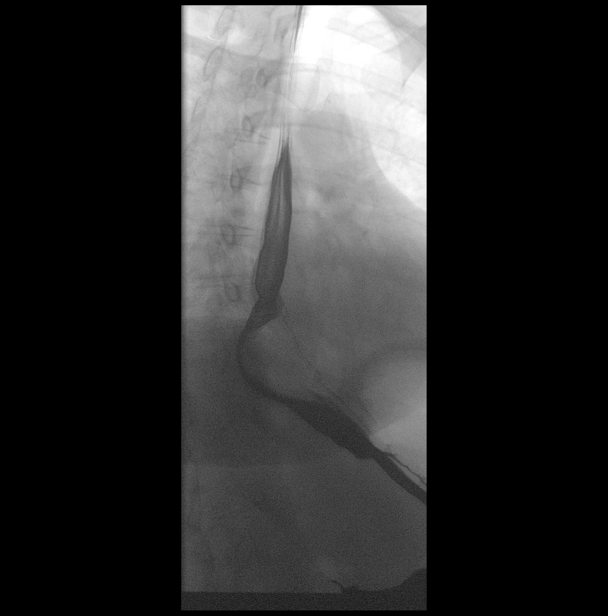

[Series 8: cp_standard · 0.26mm/px · 1 of 1 slices shown (6 of 14)]
[im 1/1]
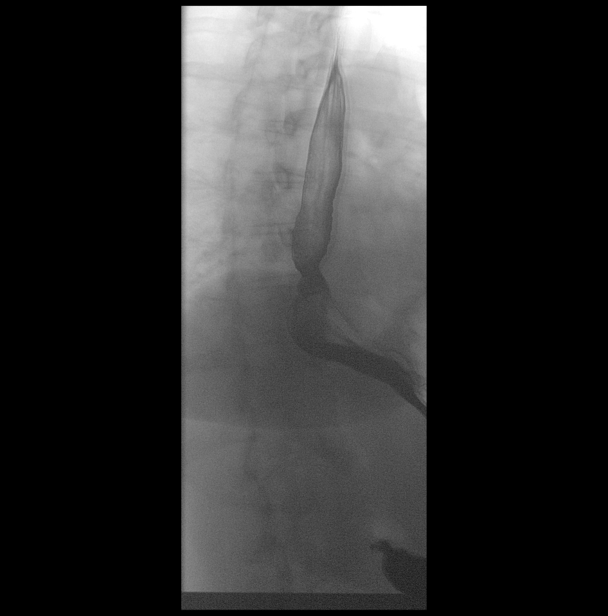

[Series 9: cp_standard · 0.27mm/px · 1 of 1 slices shown (7 of 14)]
[im 1/1]
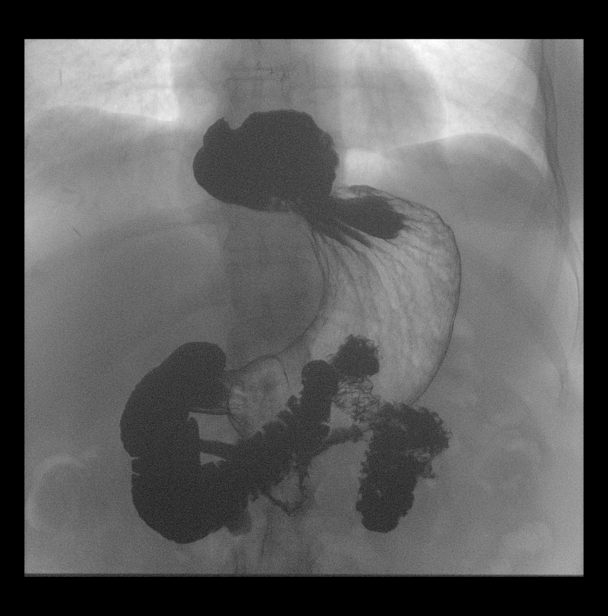

[Series 10: cp_standard · 0.27mm/px · 1 of 1 slices shown (8 of 14)]
[im 1/1]
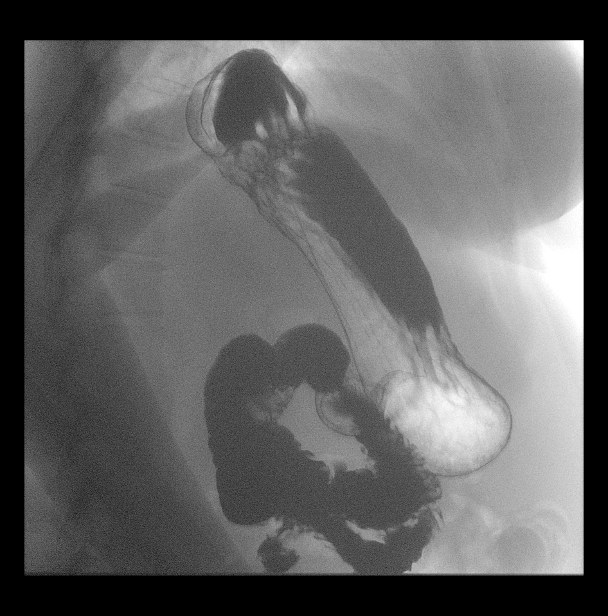

[Series 11: cp_standard · 0.28mm/px · 1 of 1 slices shown (9 of 14)]
[im 1/1]
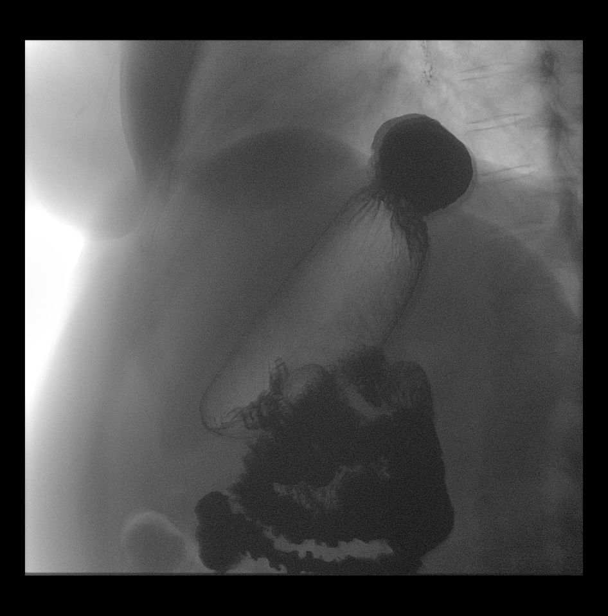

[Series 13: cp_standard · 0.28mm/px · 1 of 1 slices shown (10 of 14)]
[im 1/1]
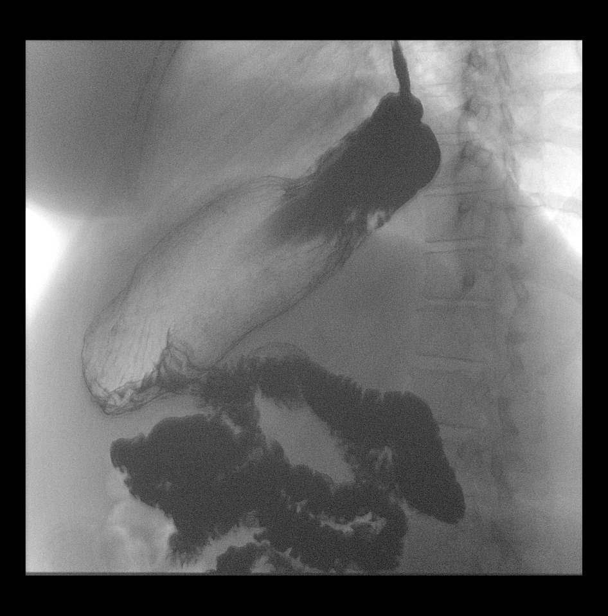

[Series 14: cp_standard · 0.28mm/px · 1 of 1 slices shown (11 of 14)]
[im 1/1]
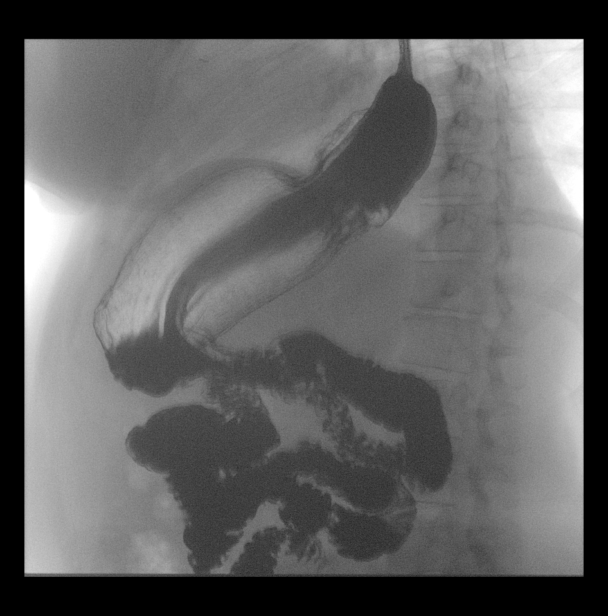

[Series 15: cp_standard · 0.28mm/px · 1 of 1 slices shown (12 of 14)]
[im 1/1]
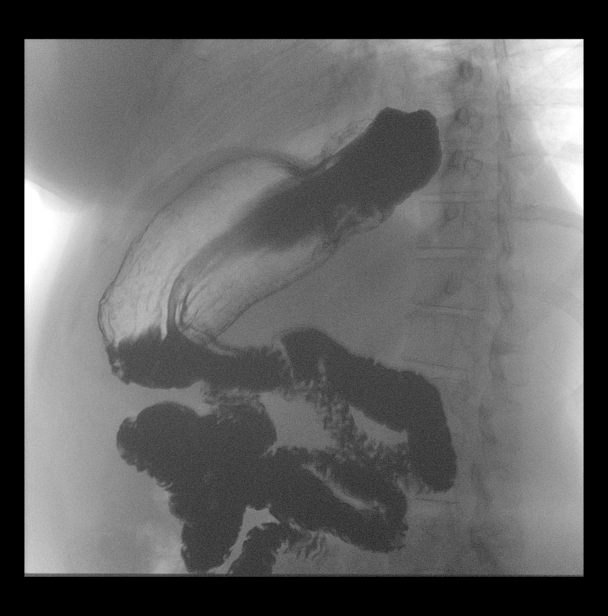

[Series 17: cp_standard · 0.28mm/px · 1 of 1 slices shown (13 of 14)]
[im 1/1]
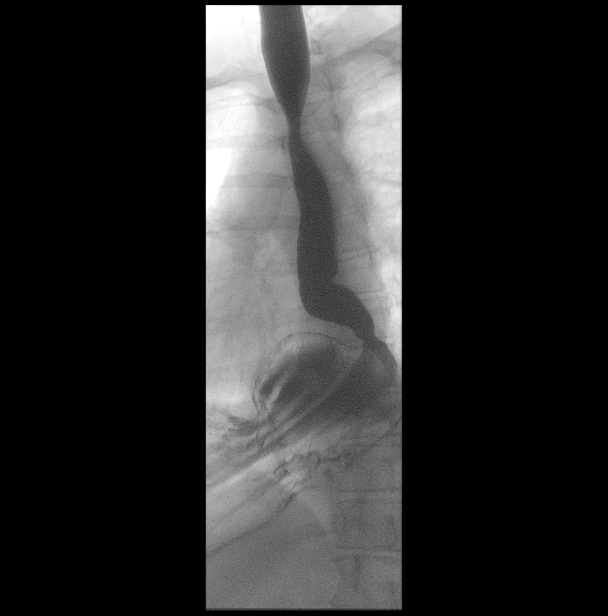

[Series 18: cp_standard · 0.28mm/px · 1 of 1 slices shown (14 of 14)]
[im 1/1]
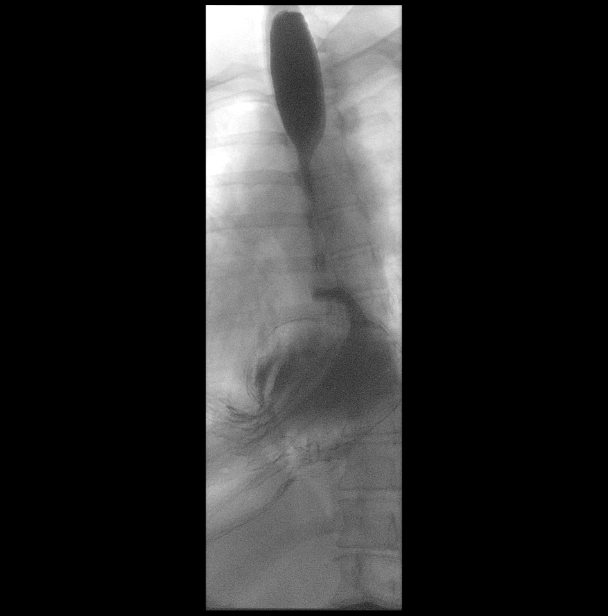

[14 of 18 positions shown; findings below may reference images not displayed]

FINDINGS: Examination of the esophagus demonstrated normal esophageal
motility. Normal esophageal morphology without evidence of
esophagitis or ulceration. No esophageal stricture, diverticula, or
mass lesion. Large hiatal hernia. Moderate gastroesophageal reflux.

Examination of the stomach demonstrated normal rugal folds and areae
gastricae. The gastric mucosa appeared unremarkable without evidence
of ulceration, scarring, or mass lesion. Gastric motility and
emptying was normal. Fluoroscopic examination of the duodenum
demonstrates normal motility and morphology without evidence of
ulceration or mass lesion.
IMPRESSION: 1. Large hiatal hernia.
2. Moderate gastroesophageal reflux.

## 2017-12-09 ENCOUNTER — Inpatient Hospital Stay: Payer: Medicaid Other

## 2017-12-09 ENCOUNTER — Inpatient Hospital Stay: Payer: Medicaid Other | Admitting: Hematology and Oncology

## 2017-12-09 ENCOUNTER — Telehealth: Payer: Self-pay | Admitting: *Deleted
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
## 2017-12-09 NOTE — Telephone Encounter (Signed)
Patient called and asked if she was scheduled for MRI that [facility] was supposed to have referred her for and said that if not, to cancel her appointment today with Dr Humberto Seals until both appts could be done on the same day

## 2017-12-26 ENCOUNTER — Encounter: Payer: Self-pay | Admitting: Hematology and Oncology

## 2017-12-27 ENCOUNTER — Other Ambulatory Visit: Payer: Self-pay | Admitting: Urgent Care

## 2017-12-27 DIAGNOSIS — T85848D Pain due to other internal prosthetic devices, implants and grafts, subsequent encounter: Secondary | ICD-10-CM

## 2017-12-27 DIAGNOSIS — Z9013 Acquired absence of bilateral breasts and nipples: Secondary | ICD-10-CM

## 2017-12-27 DIAGNOSIS — C50019 Malignant neoplasm of nipple and areola, unspecified female breast: Secondary | ICD-10-CM

## 2017-12-27 DIAGNOSIS — N644 Mastodynia: Secondary | ICD-10-CM

## 2018-01-04 ENCOUNTER — Ambulatory Visit (HOSPITAL_COMMUNITY): Admission: RE | Admit: 2018-01-04 | Payer: Medicaid Other | Source: Ambulatory Visit

## 2019-06-19 ENCOUNTER — Other Ambulatory Visit: Payer: Self-pay

## 2019-06-19 DIAGNOSIS — Z20822 Contact with and (suspected) exposure to covid-19: Secondary | ICD-10-CM

## 2019-06-21 LAB — NOVEL CORONAVIRUS, NAA: SARS-CoV-2, NAA: NOT DETECTED

## 2019-08-28 ENCOUNTER — Ambulatory Visit: Payer: Self-pay | Admitting: Cardiovascular Disease

## 2019-08-28 DIAGNOSIS — I200 Unstable angina: Secondary | ICD-10-CM | POA: Insufficient documentation

## 2019-08-28 MED ORDER — SODIUM CHLORIDE 0.9% FLUSH
3.0000 mL | Freq: Two times a day (BID) | INTRAVENOUS | Status: AC
  Filled 2019-08-28: qty 3

## 2019-08-30 ENCOUNTER — Other Ambulatory Visit: Payer: Self-pay

## 2019-08-30 ENCOUNTER — Other Ambulatory Visit
Admission: RE | Admit: 2019-08-30 | Discharge: 2019-08-30 | Disposition: A | Discharge status: Home / Self Care | Payer: Medicare Other | Source: Ambulatory Visit | Attending: Cardiovascular Disease | Admitting: Cardiovascular Disease

## 2019-08-30 DIAGNOSIS — Z20822 Contact with and (suspected) exposure to covid-19: Secondary | ICD-10-CM | POA: Diagnosis not present

## 2019-08-30 DIAGNOSIS — Z01812 Encounter for preprocedural laboratory examination: Secondary | ICD-10-CM | POA: Diagnosis present

## 2019-08-30 LAB — SARS CORONAVIRUS 2 (TAT 6-24 HRS): SARS Coronavirus 2: NEGATIVE

## 2019-09-01 ENCOUNTER — Other Ambulatory Visit: Payer: Self-pay

## 2019-09-01 ENCOUNTER — Encounter: Payer: Self-pay | Admitting: Cardiovascular Disease

## 2019-09-01 ENCOUNTER — Encounter
Admission: RE | Disposition: A | Discharge status: Home / Self Care | Payer: Self-pay | Source: Home / Self Care | Attending: Cardiovascular Disease

## 2019-09-01 ENCOUNTER — Ambulatory Visit
Admission: RE | Admit: 2019-09-01 | Discharge: 2019-09-01 | Disposition: A | Discharge status: Home / Self Care | Payer: Medicare Other | Attending: Cardiovascular Disease | Admitting: Cardiovascular Disease

## 2019-09-01 DIAGNOSIS — Z886 Allergy status to analgesic agent status: Secondary | ICD-10-CM | POA: Diagnosis not present

## 2019-09-01 DIAGNOSIS — Z91013 Allergy to seafood: Secondary | ICD-10-CM | POA: Insufficient documentation

## 2019-09-01 DIAGNOSIS — I110 Hypertensive heart disease with heart failure: Secondary | ICD-10-CM | POA: Diagnosis not present

## 2019-09-01 DIAGNOSIS — R9431 Abnormal electrocardiogram [ECG] [EKG]: Secondary | ICD-10-CM | POA: Insufficient documentation

## 2019-09-01 DIAGNOSIS — I509 Heart failure, unspecified: Secondary | ICD-10-CM | POA: Insufficient documentation

## 2019-09-01 DIAGNOSIS — R0602 Shortness of breath: Secondary | ICD-10-CM | POA: Insufficient documentation

## 2019-09-01 DIAGNOSIS — Z7951 Long term (current) use of inhaled steroids: Secondary | ICD-10-CM | POA: Diagnosis not present

## 2019-09-01 DIAGNOSIS — J45909 Unspecified asthma, uncomplicated: Secondary | ICD-10-CM | POA: Insufficient documentation

## 2019-09-01 DIAGNOSIS — Z888 Allergy status to other drugs, medicaments and biological substances status: Secondary | ICD-10-CM | POA: Insufficient documentation

## 2019-09-01 DIAGNOSIS — F319 Bipolar disorder, unspecified: Secondary | ICD-10-CM | POA: Insufficient documentation

## 2019-09-01 DIAGNOSIS — Z79899 Other long term (current) drug therapy: Secondary | ICD-10-CM | POA: Diagnosis not present

## 2019-09-01 DIAGNOSIS — Z853 Personal history of malignant neoplasm of breast: Secondary | ICD-10-CM | POA: Diagnosis not present

## 2019-09-01 DIAGNOSIS — E785 Hyperlipidemia, unspecified: Secondary | ICD-10-CM | POA: Diagnosis not present

## 2019-09-01 DIAGNOSIS — Z8249 Family history of ischemic heart disease and other diseases of the circulatory system: Secondary | ICD-10-CM | POA: Diagnosis not present

## 2019-09-01 DIAGNOSIS — Z923 Personal history of irradiation: Secondary | ICD-10-CM | POA: Diagnosis not present

## 2019-09-01 DIAGNOSIS — I200 Unstable angina: Secondary | ICD-10-CM

## 2019-09-01 HISTORY — PX: LEFT HEART CATH AND CORONARY ANGIOGRAPHY: CATH118249

## 2019-09-01 SURGERY — LEFT HEART CATH AND CORONARY ANGIOGRAPHY
Anesthesia: Moderate Sedation

## 2019-09-01 MED ORDER — VERAPAMIL HCL 2.5 MG/ML IV SOLN
INTRAVENOUS | Status: DC | PRN
  Administered 2019-09-01: 2.5 mg via INTRA_ARTERIAL

## 2019-09-01 MED ORDER — SODIUM CHLORIDE 0.9 % WEIGHT BASED INFUSION: 1.0000 mL/kg/h | INTRAVENOUS | Status: DC

## 2019-09-01 MED ORDER — MORPHINE SULFATE (PF) 2 MG/ML IV SOLN
INTRAVENOUS | Status: AC
  Administered 2019-09-01: 5 mg via INTRAVENOUS
  Filled 2019-09-01: qty 3

## 2019-09-01 MED ORDER — HEPARIN SODIUM (PORCINE) 1000 UNIT/ML IJ SOLN
INTRAMUSCULAR | Status: AC
  Filled 2019-09-01: qty 1

## 2019-09-01 MED ORDER — MIDAZOLAM HCL 2 MG/2ML IJ SOLN
INTRAMUSCULAR | Status: DC | PRN
  Administered 2019-09-01 (×3): 1 mg via INTRAVENOUS

## 2019-09-01 MED ORDER — SODIUM CHLORIDE 0.9% FLUSH: 3.0000 mL | Freq: Two times a day (BID) | INTRAVENOUS | Status: DC

## 2019-09-01 MED ORDER — FENTANYL CITRATE (PF) 100 MCG/2ML IJ SOLN
INTRAMUSCULAR | Status: DC | PRN
  Administered 2019-09-01 (×3): 50 ug via INTRAVENOUS

## 2019-09-01 MED ORDER — SODIUM CHLORIDE 0.9 % IV SOLN: 250.0000 mL | INTRAVENOUS | Status: DC | PRN

## 2019-09-01 MED ORDER — MIDAZOLAM HCL 2 MG/2ML IJ SOLN
INTRAMUSCULAR | Status: AC
  Filled 2019-09-01: qty 2

## 2019-09-01 MED ORDER — ACETAMINOPHEN 325 MG PO TABS: 650.0000 mg | ORAL_TABLET | ORAL | Status: DC | PRN

## 2019-09-01 MED ORDER — FENTANYL CITRATE (PF) 100 MCG/2ML IJ SOLN
INTRAMUSCULAR | Status: AC
  Filled 2019-09-01: qty 2

## 2019-09-01 MED ORDER — HEPARIN (PORCINE) IN NACL 1000-0.9 UT/500ML-% IV SOLN
INTRAVENOUS | Status: AC
  Filled 2019-09-01: qty 1000

## 2019-09-01 MED ORDER — HEPARIN (PORCINE) IN NACL 1000-0.9 UT/500ML-% IV SOLN
INTRAVENOUS | Status: DC | PRN
  Administered 2019-09-01: 500 mL

## 2019-09-01 MED ORDER — ASPIRIN 81 MG PO CHEW: 81.0000 mg | CHEWABLE_TABLET | ORAL | Status: DC

## 2019-09-01 MED ORDER — VERAPAMIL HCL 2.5 MG/ML IV SOLN
INTRAVENOUS | Status: AC
  Filled 2019-09-01: qty 2

## 2019-09-01 MED ORDER — HYDRALAZINE HCL 20 MG/ML IJ SOLN: 10.0000 mg | INTRAMUSCULAR | Status: DC | PRN

## 2019-09-01 MED ORDER — SODIUM CHLORIDE 0.9 % WEIGHT BASED INFUSION
3.0000 mL/kg/h | INTRAVENOUS | Status: DC
  Administered 2019-09-01: 3 mL/kg/h via INTRAVENOUS

## 2019-09-01 MED ORDER — SODIUM CHLORIDE 0.9% FLUSH: 3.0000 mL | INTRAVENOUS | Status: DC | PRN

## 2019-09-01 MED ORDER — ONDANSETRON HCL 4 MG/2ML IJ SOLN: 4.0000 mg | Freq: Four times a day (QID) | INTRAMUSCULAR | Status: DC | PRN

## 2019-09-01 MED ORDER — MORPHINE SULFATE (PF) 2 MG/ML IV SOLN: 5.0000 mg | INTRAVENOUS | Status: DC | PRN

## 2019-09-01 MED ORDER — IOHEXOL 300 MG/ML  SOLN
INTRAMUSCULAR | Status: DC | PRN
  Administered 2019-09-01: 90 mL

## 2019-09-01 MED ORDER — LABETALOL HCL 5 MG/ML IV SOLN: 10.0000 mg | INTRAVENOUS | Status: DC | PRN

## 2019-09-01 SURGICAL SUPPLY — 12 items
CATH INFINITI 5FR ANG PIGTAIL (CATHETERS) ×2 IMPLANT
CATH INFINITI 5FR JL4 (CATHETERS) ×2 IMPLANT
CATH INFINITI JR4 5F (CATHETERS) ×2 IMPLANT
DEVICE CLOSURE MYNXGRIP 5F (Vascular Products) ×2 IMPLANT
DEVICE RAD TR BAND REGULAR (VASCULAR PRODUCTS) ×2 IMPLANT
GLIDESHEATH SLEND SS 6F .021 (SHEATH) ×2 IMPLANT
KIT MANI 3VAL PERCEP (MISCELLANEOUS) ×2 IMPLANT
NEEDLE PERC 18GX7CM (NEEDLE) ×2 IMPLANT
PACK CARDIAC CATH (CUSTOM PROCEDURE TRAY) ×2 IMPLANT
SHEATH AVANTI 5FR X 11CM (SHEATH) ×2 IMPLANT
WIRE GUIDERIGHT .035X150 (WIRE) ×2 IMPLANT
WIRE ROSEN-J .035X260CM (WIRE) ×2 IMPLANT

## 2019-12-22 ENCOUNTER — Ambulatory Visit: Payer: Medicare Other | Attending: Internal Medicine

## 2019-12-22 DIAGNOSIS — Z23 Encounter for immunization: Secondary | ICD-10-CM

## 2019-12-22 NOTE — Progress Notes (Signed)
   Covid-19 Vaccination Clinic  Name:  [name]    MRN: [id number] DOB: 08-21-1970  12/22/2019  [name] was observed post Covid-19 immunization for 15 minutes without incident. She was provided with Vaccine Information Sheet and instruction to access the V-Safe system.   [name] was instructed to call 911 with any severe reactions post vaccine: Marland Kitchen Difficulty breathing  . Swelling of face and throat  . A fast heartbeat  . A bad rash all over body  . Dizziness and weakness   Immunizations Administered    Name Date Dose VIS Date Route   Pfizer COVID-19 Vaccine 12/22/2019  8:56 AM 0.3 mL 09/27/2018 Intramuscular   Manufacturer: [place]   Lot: [id number]   [place]: [id number]

## 2019-12-23 ENCOUNTER — Ambulatory Visit: Payer: Medicare Other

## 2020-01-12 ENCOUNTER — Ambulatory Visit: Payer: Medicare Other | Attending: Internal Medicine

## 2020-01-12 DIAGNOSIS — Z23 Encounter for immunization: Secondary | ICD-10-CM

## 2020-01-12 NOTE — Progress Notes (Signed)
   Covid-19 Vaccination Clinic  Name:  [name]    MRN: [id number] DOB: 09/23/1970  01/12/2020  [name] was observed post Covid-19 immunization for 15 minutes without incident. She was provided with Vaccine Information Sheet and instruction to access the V-Safe system.   [name] was instructed to call 911 with any severe reactions post vaccine: Marland Kitchen Difficulty breathing  . Swelling of face and throat  . A fast heartbeat  . A bad rash all over body  . Dizziness and weakness   Immunizations Administered    Name Date Dose VIS Date Route   Pfizer COVID-19 Vaccine 01/12/2020  8:16 AM 0.3 mL 09/27/2018 Intramuscular   Manufacturer: [place]   Lot: [id number]   [place]: [id number]

## 2020-04-15 ENCOUNTER — Telehealth: Payer: Self-pay | Admitting: Obstetrics & Gynecology

## 2020-04-15 NOTE — Telephone Encounter (Signed)
[facility] referring for Well woman exam. Paper records. Called and left voicemail for patient to call back to be scheduled.

## 2020-04-16 NOTE — Telephone Encounter (Signed)
Called and left voicemail for patient to call back to be scheduled. 

## 2020-05-17 ENCOUNTER — Ambulatory Visit: Payer: Medicare Other | Admitting: Obstetrics and Gynecology

## 2020-07-15 ENCOUNTER — Ambulatory Visit: Payer: Medicare Other | Admitting: Obstetrics and Gynecology

## 2020-09-03 DIAGNOSIS — Z1371 Encounter for nonprocreative screening for genetic disease carrier status: Secondary | ICD-10-CM

## 2020-09-03 HISTORY — DX: Encounter for nonprocreative screening for genetic disease carrier status: Z13.71

## 2020-09-04 ENCOUNTER — Encounter: Payer: Self-pay | Admitting: Obstetrics and Gynecology

## 2020-09-04 ENCOUNTER — Other Ambulatory Visit (HOSPITAL_COMMUNITY)
Admission: RE | Admit: 2020-09-04 | Discharge: 2020-09-04 | Disposition: A | Discharge status: Home / Self Care | Payer: Medicare Other | Source: Ambulatory Visit | Attending: Obstetrics and Gynecology | Admitting: Obstetrics and Gynecology

## 2020-09-04 ENCOUNTER — Other Ambulatory Visit: Payer: Self-pay

## 2020-09-04 ENCOUNTER — Ambulatory Visit (INDEPENDENT_AMBULATORY_CARE_PROVIDER_SITE_OTHER): Payer: Medicare Other | Admitting: Obstetrics and Gynecology

## 2020-09-04 VITALS — BP 140/90 | Ht 63.0 in | Wt 211.0 lb

## 2020-09-04 DIAGNOSIS — Z1151 Encounter for screening for human papillomavirus (HPV): Secondary | ICD-10-CM | POA: Diagnosis not present

## 2020-09-04 DIAGNOSIS — Z124 Encounter for screening for malignant neoplasm of cervix: Secondary | ICD-10-CM | POA: Diagnosis not present

## 2020-09-04 DIAGNOSIS — D0511 Intraductal carcinoma in situ of right breast: Secondary | ICD-10-CM

## 2020-09-04 DIAGNOSIS — R87810 Cervical high risk human papillomavirus (HPV) DNA test positive: Secondary | ICD-10-CM

## 2020-09-04 DIAGNOSIS — Z1211 Encounter for screening for malignant neoplasm of colon: Secondary | ICD-10-CM

## 2020-09-04 DIAGNOSIS — Z01419 Encounter for gynecological examination (general) (routine) without abnormal findings: Secondary | ICD-10-CM

## 2020-09-04 DIAGNOSIS — E28319 Asymptomatic premature menopause: Secondary | ICD-10-CM

## 2020-09-04 DIAGNOSIS — Z13820 Encounter for screening for osteoporosis: Secondary | ICD-10-CM

## 2020-09-04 NOTE — Patient Instructions (Signed)
I value your feedback and you entrusting us with your care. If you get a  patient survey, I would appreciate you taking the time to let us know about your experience today. Thank you! ? ? ?
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
## 2020-09-06 LAB — CYTOLOGY - PAP
Comment: NEGATIVE
Diagnosis: NEGATIVE
High risk HPV: NEGATIVE

## 2020-09-17 ENCOUNTER — Other Ambulatory Visit: Payer: Self-pay

## 2020-09-17 ENCOUNTER — Ambulatory Visit
Admission: RE | Admit: 2020-09-17 | Discharge: 2020-09-17 | Disposition: A | Discharge status: Home / Self Care | Payer: Medicare Other | Source: Ambulatory Visit | Attending: Obstetrics and Gynecology | Admitting: Obstetrics and Gynecology

## 2020-09-17 DIAGNOSIS — Z13820 Encounter for screening for osteoporosis: Secondary | ICD-10-CM | POA: Diagnosis present

## 2020-09-17 DIAGNOSIS — E28319 Asymptomatic premature menopause: Secondary | ICD-10-CM | POA: Insufficient documentation

## 2020-09-23 ENCOUNTER — Encounter: Payer: Self-pay | Admitting: Obstetrics and Gynecology

## 2020-09-30 ENCOUNTER — Telehealth: Payer: Self-pay | Admitting: Obstetrics and Gynecology

## 2020-09-30 NOTE — Telephone Encounter (Signed)
Pt aware she is MyRisk update test negative (and BRCA neg). Pt with hx of DCIS, so no TC model/riskscore calculated.   Patient understands these results only apply to her and her children, and this is not indicative of genetic testing results of her other family members. It is recommended that her other family members have genetic testing done.  Pt also understands negative genetic testing doesn't mean she will never get any of these cancers.   Hard copy mailed to pt. F/u prn.

## 2021-01-17 ENCOUNTER — Encounter (INDEPENDENT_AMBULATORY_CARE_PROVIDER_SITE_OTHER): Payer: Self-pay

## 2021-04-01 ENCOUNTER — Encounter (INDEPENDENT_AMBULATORY_CARE_PROVIDER_SITE_OTHER): Payer: Self-pay

## 2021-05-20 ENCOUNTER — Other Ambulatory Visit (INDEPENDENT_AMBULATORY_CARE_PROVIDER_SITE_OTHER): Payer: Self-pay | Admitting: Vascular Surgery

## 2021-05-20 DIAGNOSIS — I739 Peripheral vascular disease, unspecified: Secondary | ICD-10-CM

## 2021-05-21 ENCOUNTER — Ambulatory Visit (INDEPENDENT_AMBULATORY_CARE_PROVIDER_SITE_OTHER): Payer: Medicare Other | Admitting: Nurse Practitioner

## 2021-05-21 ENCOUNTER — Ambulatory Visit (INDEPENDENT_AMBULATORY_CARE_PROVIDER_SITE_OTHER): Payer: Medicare Other

## 2021-05-21 ENCOUNTER — Other Ambulatory Visit: Payer: Self-pay

## 2021-05-21 VITALS — BP 143/96 | HR 105 | Ht 63.0 in | Wt 210.0 lb

## 2021-05-21 DIAGNOSIS — I739 Peripheral vascular disease, unspecified: Secondary | ICD-10-CM

## 2021-05-21 DIAGNOSIS — M797 Fibromyalgia: Secondary | ICD-10-CM | POA: Diagnosis not present

## 2021-05-26 ENCOUNTER — Encounter (INDEPENDENT_AMBULATORY_CARE_PROVIDER_SITE_OTHER): Payer: Self-pay | Admitting: Nurse Practitioner
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
## 2021-05-29 ENCOUNTER — Encounter (INDEPENDENT_AMBULATORY_CARE_PROVIDER_SITE_OTHER): Payer: Self-pay

## 2021-06-06 ENCOUNTER — Ambulatory Visit (HOSPITAL_COMMUNITY): Admission: RE | Admit: 2021-06-06 | Payer: Medicare Other | Source: Ambulatory Visit

## 2021-06-09 ENCOUNTER — Other Ambulatory Visit: Payer: Self-pay

## 2021-06-09 ENCOUNTER — Emergency Department: Payer: Medicare Other

## 2021-06-09 ENCOUNTER — Telehealth (INDEPENDENT_AMBULATORY_CARE_PROVIDER_SITE_OTHER): Payer: Self-pay

## 2021-06-09 DIAGNOSIS — R531 Weakness: Secondary | ICD-10-CM | POA: Diagnosis present

## 2021-06-09 DIAGNOSIS — Z79899 Other long term (current) drug therapy: Secondary | ICD-10-CM | POA: Diagnosis not present

## 2021-06-09 DIAGNOSIS — Z853 Personal history of malignant neoplasm of breast: Secondary | ICD-10-CM | POA: Insufficient documentation

## 2021-06-09 DIAGNOSIS — J45909 Unspecified asthma, uncomplicated: Secondary | ICD-10-CM | POA: Diagnosis not present

## 2021-06-09 DIAGNOSIS — Z87891 Personal history of nicotine dependence: Secondary | ICD-10-CM | POA: Insufficient documentation

## 2021-06-09 DIAGNOSIS — M79605 Pain in left leg: Secondary | ICD-10-CM | POA: Insufficient documentation

## 2021-06-09 LAB — CBC
HCT: 39.7 % (ref 36.0–46.0)
Hemoglobin: 13.5 g/dL (ref 12.0–15.0)
MCH: 29.8 pg (ref 26.0–34.0)
MCHC: 34 g/dL (ref 30.0–36.0)
MCV: 87.6 fL (ref 80.0–100.0)
Platelets: 286 10*3/uL (ref 150–400)
RBC: 4.53 MIL/uL (ref 3.87–5.11)
RDW: 15 % (ref 11.5–15.5)
WBC: 6.8 10*3/uL (ref 4.0–10.5)
nRBC: 0 % (ref 0.0–0.2)

## 2021-06-09 LAB — BASIC METABOLIC PANEL
Anion gap: 9 (ref 5–15)
BUN: 11 mg/dL (ref 6–20)
CO2: 27 mmol/L (ref 22–32)
Calcium: 9.6 mg/dL (ref 8.9–10.3)
Chloride: 102 mmol/L (ref 98–111)
Creatinine, Ser: 0.62 mg/dL (ref 0.44–1.00)
GFR, Estimated: >60 mL/min (ref >60)
Glucose, Bld: 107 mg/dL — ABNORMAL HIGH (ref 70–99)
Potassium: 3.7 mmol/L (ref 3.5–5.1)
Sodium: 138 mmol/L (ref 135–145)

## 2021-06-09 LAB — TROPONIN I (HIGH SENSITIVITY): Troponin I (High Sensitivity): 8 ng/L (ref <18)

## 2021-06-09 NOTE — Telephone Encounter (Signed)
Contrast protocol was called into [facility] and I left a message on patient voicemail.

## 2021-06-09 NOTE — ED Provider Notes (Signed)
Emergency Medicine Provider Triage Evaluation Note  [name] , a 50 y.o. female  was evaluated in triage.  Pt complains of leg pain/weakness.  Patient states was standing earlier today and walking her legs gave way.  She has had similar symptoms going on for 3 months.  She denies any back pain numbness tingling radicular symptoms.  No pain or discomfort while sitting.  Her pain is only with standing and walking short distances.  She has been evaluated by vascular for PAD, is scheduled for CT angiogram of both femurs later this week.  No fevers, upper extremity weakness.  She denies any injury to her body when falling today. Review of Systems  Positive: Leg weakness, bilateral leg pain Negative: Swelling, numbness, back pain  Physical Exam  BP (!) 156/109 (BP Location: Left Arm)   Pulse 100   Resp 18   LMP 09/14/2011   SpO2 97%  Gen:   Awake, no distress presents in a wheelchair Resp:  Normal effort  MSK:   Moves extremities without difficulty.  Normal hip internal X rotation bilaterally with no discomfort.  She is able to actively straight leg raise both lower extremities.  No clonus noted.  Pulses present distally.  No warmth or edema in the lower extremities    Medical Decision Making  Medically screening exam initiated at 8:19 PM.  Appropriate orders placed.  INNA TISDELL was informed that the remainder of the evaluation will be completed by another provider, this initial triage assessment does not replace that evaluation, and the importance of remaining in the ED until their evaluation is complete.  Will order basic blood work, EKG, troponin and ultrasound bilateral lower extremities.  Patient is having chronic leg pain with ambulation.  Currently being evaluated by vascular for PAD with imaging scheduled for later this week.   Duanne Guess, PA-C 06/09/21 2023    Arta Silence, MD 06/09/21 2250

## 2021-06-09 NOTE — ED Notes (Signed)
[facility] nurse-pt brought [facility]  Pt has bil leg pain and weakness for 3 months.  Bp-165/105.  Pt alert.  Pt in wheelchair in lobby.

## 2021-06-09 NOTE — ED Triage Notes (Signed)
Pt presents to ER c/o BIL leg weakness that has been going on since August.  Pt states she came in tonight d/t her legs collapsing on [facility] when [facility].  Pt states she has had [place] of both legs which was inconclusive and has CT scan w/contrast scheduled for Friday.  Pt states she only has pain in her legs when standing or walking.  Good pedal pulses noted to BIL feet.

## 2021-06-09 NOTE — Telephone Encounter (Signed)
Can we call to see if she can do the CT scan at a different place, or if a new order is needed?

## 2021-06-10 ENCOUNTER — Encounter (HOSPITAL_COMMUNITY): Payer: Self-pay

## 2021-06-10 ENCOUNTER — Telehealth (INDEPENDENT_AMBULATORY_CARE_PROVIDER_SITE_OTHER): Payer: Self-pay

## 2021-06-10 ENCOUNTER — Ambulatory Visit (HOSPITAL_COMMUNITY): Payer: Medicare Other

## 2021-06-10 ENCOUNTER — Emergency Department
Admission: EM | Admit: 2021-06-10 | Discharge: 2021-06-10 | Disposition: A | Discharge status: Home / Self Care | Payer: Medicare Other | Attending: Emergency Medicine | Admitting: Emergency Medicine

## 2021-06-10 DIAGNOSIS — R29898 Other symptoms and signs involving the musculoskeletal system: Secondary | ICD-10-CM

## 2021-06-10 LAB — TROPONIN I (HIGH SENSITIVITY): Troponin I (High Sensitivity): 9 ng/L (ref <18)

## 2021-06-10 NOTE — Telephone Encounter (Signed)
Patient has a dye allergy and the protocol was called [facility] for Prednisone 50 mg 3 tabs take 13 hours before, 7 hours before and day of procedure, Benadryl 25 mg 2 tabs  one day before and one day of and Pepcid 40 mg 1 tab take day of procedure. Patient was called and given this information.

## 2021-06-10 NOTE — Discharge Instructions (Signed)
Follow up on Friday for your CT scan.  Return to the ED with any stroke like symptoms, bad falls or passing out.
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
## 2021-06-13 ENCOUNTER — Encounter (HOSPITAL_COMMUNITY): Payer: Self-pay

## 2021-06-13 ENCOUNTER — Ambulatory Visit (HOSPITAL_COMMUNITY): Payer: Medicare Other

## 2021-06-16 ENCOUNTER — Ambulatory Visit (INDEPENDENT_AMBULATORY_CARE_PROVIDER_SITE_OTHER): Payer: Medicare Other | Admitting: Vascular Surgery

## 2021-07-03 ENCOUNTER — Encounter (HOSPITAL_COMMUNITY): Payer: Self-pay

## 2021-07-03 ENCOUNTER — Ambulatory Visit (HOSPITAL_COMMUNITY)
Admission: RE | Admit: 2021-07-03 | Discharge: 2021-07-03 | Disposition: A | Discharge status: Home / Self Care | Payer: Medicare Other | Source: Ambulatory Visit | Attending: Nurse Practitioner | Admitting: Nurse Practitioner

## 2021-07-03 ENCOUNTER — Other Ambulatory Visit: Payer: Self-pay

## 2021-07-03 DIAGNOSIS — I739 Peripheral vascular disease, unspecified: Secondary | ICD-10-CM

## 2021-07-04 ENCOUNTER — Ambulatory Visit (INDEPENDENT_AMBULATORY_CARE_PROVIDER_SITE_OTHER): Payer: Medicare Other | Admitting: Vascular Surgery

## 2021-07-04 ENCOUNTER — Telehealth (INDEPENDENT_AMBULATORY_CARE_PROVIDER_SITE_OTHER): Payer: Self-pay

## 2021-07-04 NOTE — Telephone Encounter (Signed)
Patient left a voicemail requested for iodinated dye allergy to be removed from chart stating that she does not have allergy to this. Allergy has been removed as requested. Patient stated that CT could not be done due to not taking Benadryl tablet one hour before. Patient will be rescheduling CT and will contact the office to schedule follow up with Dr Lucky Cowboy.

## 2021-07-05 ENCOUNTER — Other Ambulatory Visit (INDEPENDENT_AMBULATORY_CARE_PROVIDER_SITE_OTHER): Payer: Self-pay | Admitting: Nurse Practitioner

## 2021-07-11 ENCOUNTER — Encounter: Payer: Self-pay | Admitting: Intensive Care

## 2021-07-11 ENCOUNTER — Inpatient Hospital Stay: Payer: Medicare Other

## 2021-07-11 ENCOUNTER — Other Ambulatory Visit: Payer: Self-pay

## 2021-07-11 ENCOUNTER — Inpatient Hospital Stay
Admission: EM | Admit: 2021-07-11 | Discharge: 2021-07-15 | DRG: 558 | Disposition: A | Discharge status: Skilled Nursing Facility | Payer: Medicare Other | Attending: Student in an Organized Health Care Education/Training Program | Admitting: Student in an Organized Health Care Education/Training Program

## 2021-07-11 ENCOUNTER — Emergency Department: Payer: Medicare Other

## 2021-07-11 DIAGNOSIS — G4700 Insomnia, unspecified: Secondary | ICD-10-CM | POA: Diagnosis present

## 2021-07-11 DIAGNOSIS — M797 Fibromyalgia: Secondary | ICD-10-CM | POA: Diagnosis present

## 2021-07-11 DIAGNOSIS — Z91013 Allergy to seafood: Secondary | ICD-10-CM

## 2021-07-11 DIAGNOSIS — F1120 Opioid dependence, uncomplicated: Secondary | ICD-10-CM | POA: Diagnosis present

## 2021-07-11 DIAGNOSIS — Z20822 Contact with and (suspected) exposure to covid-19: Secondary | ICD-10-CM | POA: Diagnosis present

## 2021-07-11 DIAGNOSIS — D649 Anemia, unspecified: Secondary | ICD-10-CM | POA: Diagnosis present

## 2021-07-11 DIAGNOSIS — Z85828 Personal history of other malignant neoplasm of skin: Secondary | ICD-10-CM | POA: Diagnosis not present

## 2021-07-11 DIAGNOSIS — Z886 Allergy status to analgesic agent status: Secondary | ICD-10-CM

## 2021-07-11 DIAGNOSIS — M3320 Polymyositis, organ involvement unspecified: Secondary | ICD-10-CM | POA: Diagnosis present

## 2021-07-11 DIAGNOSIS — I10 Essential (primary) hypertension: Secondary | ICD-10-CM | POA: Diagnosis present

## 2021-07-11 DIAGNOSIS — J45909 Unspecified asthma, uncomplicated: Secondary | ICD-10-CM | POA: Diagnosis present

## 2021-07-11 DIAGNOSIS — Z888 Allergy status to other drugs, medicaments and biological substances status: Secondary | ICD-10-CM

## 2021-07-11 DIAGNOSIS — F3181 Bipolar II disorder: Secondary | ICD-10-CM | POA: Diagnosis present

## 2021-07-11 DIAGNOSIS — R7401 Elevation of levels of liver transaminase levels: Secondary | ICD-10-CM

## 2021-07-11 DIAGNOSIS — Z8614 Personal history of Methicillin resistant Staphylococcus aureus infection: Secondary | ICD-10-CM | POA: Diagnosis not present

## 2021-07-11 DIAGNOSIS — M6282 Rhabdomyolysis: Principal | ICD-10-CM | POA: Diagnosis present

## 2021-07-11 DIAGNOSIS — F4310 Post-traumatic stress disorder, unspecified: Secondary | ICD-10-CM

## 2021-07-11 DIAGNOSIS — M5136 Other intervertebral disc degeneration, lumbar region: Secondary | ICD-10-CM | POA: Diagnosis present

## 2021-07-11 DIAGNOSIS — Z79899 Other long term (current) drug therapy: Secondary | ICD-10-CM | POA: Diagnosis not present

## 2021-07-11 DIAGNOSIS — Z91041 Radiographic dye allergy status: Secondary | ICD-10-CM

## 2021-07-11 DIAGNOSIS — E785 Hyperlipidemia, unspecified: Secondary | ICD-10-CM | POA: Diagnosis present

## 2021-07-11 DIAGNOSIS — Z7951 Long term (current) use of inhaled steroids: Secondary | ICD-10-CM

## 2021-07-11 DIAGNOSIS — K589 Irritable bowel syndrome without diarrhea: Secondary | ICD-10-CM | POA: Diagnosis present

## 2021-07-11 DIAGNOSIS — Z853 Personal history of malignant neoplasm of breast: Secondary | ICD-10-CM

## 2021-07-11 DIAGNOSIS — Z9013 Acquired absence of bilateral breasts and nipples: Secondary | ICD-10-CM

## 2021-07-11 DIAGNOSIS — R292 Abnormal reflex: Secondary | ICD-10-CM

## 2021-07-11 DIAGNOSIS — R296 Repeated falls: Secondary | ICD-10-CM | POA: Diagnosis present

## 2021-07-11 DIAGNOSIS — Z880 Allergy status to penicillin: Secondary | ICD-10-CM

## 2021-07-11 DIAGNOSIS — K219 Gastro-esophageal reflux disease without esophagitis: Secondary | ICD-10-CM | POA: Diagnosis present

## 2021-07-11 DIAGNOSIS — G629 Polyneuropathy, unspecified: Secondary | ICD-10-CM | POA: Diagnosis present

## 2021-07-11 DIAGNOSIS — Z90711 Acquired absence of uterus with remaining cervical stump: Secondary | ICD-10-CM

## 2021-07-11 DIAGNOSIS — R29898 Other symptoms and signs involving the musculoskeletal system: Secondary | ICD-10-CM | POA: Diagnosis not present

## 2021-07-11 DIAGNOSIS — Z6837 Body mass index (BMI) 37.0-37.9, adult: Secondary | ICD-10-CM | POA: Diagnosis not present

## 2021-07-11 DIAGNOSIS — Z83438 Family history of other disorder of lipoprotein metabolism and other lipidemia: Secondary | ICD-10-CM

## 2021-07-11 DIAGNOSIS — E669 Obesity, unspecified: Secondary | ICD-10-CM | POA: Diagnosis present

## 2021-07-11 LAB — URINALYSIS, COMPLETE (UACMP) WITH MICROSCOPIC
Glucose, UA: NEGATIVE mg/dL
Ketones, ur: 80 mg/dL — AB
Leukocytes,Ua: NEGATIVE
Nitrite: NEGATIVE
Protein, ur: NEGATIVE mg/dL
Specific Gravity, Urine: 1.015 (ref 1.005–1.030)
pH: 6 (ref 5.0–8.0)

## 2021-07-11 LAB — COMPREHENSIVE METABOLIC PANEL
ALT: 451 U/L — ABNORMAL HIGH (ref 0–44)
AST: 1392 U/L — ABNORMAL HIGH (ref 15–41)
Albumin: 3.3 g/dL — ABNORMAL LOW (ref 3.5–5.0)
Alkaline Phosphatase: 51 U/L (ref 38–126)
Anion gap: 12 (ref 5–15)
BUN: 10 mg/dL (ref 6–20)
CO2: 26 mmol/L (ref 22–32)
Calcium: 8.9 mg/dL (ref 8.9–10.3)
Chloride: 98 mmol/L (ref 98–111)
Creatinine, Ser: 0.43 mg/dL — ABNORMAL LOW (ref 0.44–1.00)
GFR, Estimated: >60 mL/min (ref >60)
Glucose, Bld: 88 mg/dL (ref 70–99)
Potassium: 4 mmol/L (ref 3.5–5.1)
Sodium: 136 mmol/L (ref 135–145)
Total Bilirubin: 1.3 mg/dL — ABNORMAL HIGH (ref 0.3–1.2)
Total Protein: 7 g/dL (ref 6.5–8.1)

## 2021-07-11 LAB — CBC
HCT: 35.2 % — ABNORMAL LOW (ref 36.0–46.0)
Hemoglobin: 11.9 g/dL — ABNORMAL LOW (ref 12.0–15.0)
MCH: 29.6 pg (ref 26.0–34.0)
MCHC: 33.8 g/dL (ref 30.0–36.0)
MCV: 87.6 fL (ref 80.0–100.0)
Platelets: 290 10*3/uL (ref 150–400)
RBC: 4.02 MIL/uL (ref 3.87–5.11)
RDW: 16.2 % — ABNORMAL HIGH (ref 11.5–15.5)
WBC: 8.4 10*3/uL (ref 4.0–10.5)
nRBC: 0 % (ref 0.0–0.2)

## 2021-07-11 LAB — PROTIME-INR
INR: 0.9 (ref 0.8–1.2)
Prothrombin Time: 12.6 seconds (ref 11.4–15.2)

## 2021-07-11 LAB — RESP PANEL BY RT-PCR (FLU A&B, COVID) ARPGX2
Influenza A by PCR: NEGATIVE
Influenza B by PCR: NEGATIVE
SARS Coronavirus 2 by RT PCR: NEGATIVE

## 2021-07-11 LAB — CK: Total CK: 4799 U/L — ABNORMAL HIGH (ref 38–234)

## 2021-07-11 LAB — APTT: aPTT: 29 seconds (ref 24–36)

## 2021-07-11 LAB — SEDIMENTATION RATE: Sed Rate: 67 mm/hr — ABNORMAL HIGH (ref 0–30)

## 2021-07-11 MED ORDER — MONTELUKAST SODIUM 10 MG PO TABS
10.0000 mg | ORAL_TABLET | Freq: Every day | ORAL | Status: DC
  Administered 2021-07-11 – 2021-07-14 (×4): 10 mg via ORAL
  Filled 2021-07-11 (×4): qty 1

## 2021-07-11 MED ORDER — QUETIAPINE FUMARATE 25 MG PO TABS
50.0000 mg | ORAL_TABLET | Freq: Three times a day (TID) | ORAL | Status: DC | PRN
  Administered 2021-07-11 – 2021-07-13 (×3): 50 mg via ORAL
  Filled 2021-07-11 (×3): qty 2

## 2021-07-11 MED ORDER — ALBUTEROL SULFATE (2.5 MG/3ML) 0.083% IN NEBU: 2.5000 mg | INHALATION_SOLUTION | Freq: Four times a day (QID) | RESPIRATORY_TRACT | Status: DC | PRN

## 2021-07-11 MED ORDER — ONDANSETRON HCL 4 MG/2ML IJ SOLN
4.0000 mg | Freq: Four times a day (QID) | INTRAMUSCULAR | Status: DC | PRN
  Administered 2021-07-12 – 2021-07-13 (×2): 4 mg via INTRAVENOUS
  Filled 2021-07-11 (×2): qty 2

## 2021-07-11 MED ORDER — OXYCODONE HCL 5 MG PO TABS
5.0000 mg | ORAL_TABLET | Freq: Four times a day (QID) | ORAL | Status: DC | PRN
  Administered 2021-07-11 – 2021-07-14 (×4): 5 mg via ORAL
  Filled 2021-07-11 (×5): qty 1

## 2021-07-11 MED ORDER — FAMOTIDINE 20 MG PO TABS
20.0000 mg | ORAL_TABLET | Freq: Two times a day (BID) | ORAL | Status: DC | PRN
  Administered 2021-07-11: 20 mg via ORAL
  Filled 2021-07-11: qty 1

## 2021-07-11 MED ORDER — VITAMIN D (ERGOCALCIFEROL) 1.25 MG (50000 UNIT) PO CAPS
50000.0000 [IU] | ORAL_CAPSULE | ORAL | Status: DC
  Administered 2021-07-14: 11:00:00 50000 [IU] via ORAL
  Filled 2021-07-11: qty 1

## 2021-07-11 MED ORDER — FLUTICASONE FUROATE-VILANTEROL 200-25 MCG/ACT IN AEPB
1.0000 | INHALATION_SPRAY | Freq: Every day | RESPIRATORY_TRACT | Status: DC
  Filled 2021-07-11: qty 28

## 2021-07-11 MED ORDER — INFLUENZA VAC SPLIT QUAD 0.5 ML IM SUSY: 0.5000 mL | PREFILLED_SYRINGE | INTRAMUSCULAR | Status: DC

## 2021-07-11 MED ORDER — MORPHINE SULFATE ER 15 MG PO TBCR
15.0000 mg | EXTENDED_RELEASE_TABLET | Freq: Two times a day (BID) | ORAL | Status: DC
  Administered 2021-07-11 – 2021-07-12 (×3): 15 mg via ORAL
  Filled 2021-07-11 (×3): qty 1

## 2021-07-11 MED ORDER — ENOXAPARIN SODIUM 60 MG/0.6ML IJ SOSY
0.5000 mg/kg | PREFILLED_SYRINGE | INTRAMUSCULAR | Status: DC
  Administered 2021-07-11 – 2021-07-14 (×4): 47.5 mg via SUBCUTANEOUS
  Filled 2021-07-11 (×4): qty 0.6

## 2021-07-11 MED ORDER — LINACLOTIDE 145 MCG PO CAPS
145.0000 ug | ORAL_CAPSULE | Freq: Every day | ORAL | Status: DC | PRN
  Filled 2021-07-11: qty 1

## 2021-07-11 MED ORDER — CLONAZEPAM 0.5 MG PO TABS
1.0000 mg | ORAL_TABLET | Freq: Every day | ORAL | Status: DC
  Administered 2021-07-11 – 2021-07-14 (×4): 1 mg via ORAL
  Filled 2021-07-11 (×4): qty 2

## 2021-07-11 MED ORDER — ONDANSETRON HCL 4 MG PO TABS: 4.0000 mg | ORAL_TABLET | Freq: Four times a day (QID) | ORAL | Status: DC | PRN

## 2021-07-11 MED ORDER — SERTRALINE HCL 100 MG PO TABS
300.0000 mg | ORAL_TABLET | Freq: Every day | ORAL | Status: DC
  Administered 2021-07-12 – 2021-07-14 (×3): 300 mg via ORAL
  Filled 2021-07-11 (×2): qty 3
  Filled 2021-07-11: qty 6
  Filled 2021-07-11: qty 3
  Filled 2021-07-11 (×2): qty 6

## 2021-07-11 MED ORDER — LORATADINE 10 MG PO TABS
10.0000 mg | ORAL_TABLET | Freq: Every day | ORAL | Status: DC
  Administered 2021-07-13 – 2021-07-14 (×2): 10 mg via ORAL
  Filled 2021-07-11 (×3): qty 1

## 2021-07-11 MED ORDER — GABAPENTIN 300 MG PO CAPS
300.0000 mg | ORAL_CAPSULE | Freq: Two times a day (BID) | ORAL | Status: DC
  Administered 2021-07-11 – 2021-07-14 (×7): 300 mg via ORAL
  Filled 2021-07-11 (×7): qty 1

## 2021-07-11 MED ORDER — PANTOPRAZOLE SODIUM 40 MG PO TBEC
40.0000 mg | DELAYED_RELEASE_TABLET | Freq: Two times a day (BID) | ORAL | Status: DC
  Administered 2021-07-12 – 2021-07-15 (×7): 40 mg via ORAL
  Filled 2021-07-11 (×7): qty 1

## 2021-07-11 MED ORDER — CYCLOBENZAPRINE HCL 10 MG PO TABS
10.0000 mg | ORAL_TABLET | Freq: Three times a day (TID) | ORAL | Status: DC
  Administered 2021-07-11 – 2021-07-14 (×11): 10 mg via ORAL
  Filled 2021-07-11 (×11): qty 1

## 2021-07-11 MED ORDER — ALBUTEROL SULFATE (2.5 MG/3ML) 0.083% IN NEBU
2.5000 mg | INHALATION_SOLUTION | Freq: Two times a day (BID) | RESPIRATORY_TRACT | Status: DC
  Filled 2021-07-11: qty 3

## 2021-07-11 MED ORDER — SODIUM CHLORIDE 0.9 % IV SOLN
1000.0000 mL | Freq: Once | INTRAVENOUS | Status: AC
  Administered 2021-07-11: 1000 mL via INTRAVENOUS

## 2021-07-11 MED ORDER — GADOBUTROL 1 MMOL/ML IV SOLN
9.0000 mL | Freq: Once | INTRAVENOUS | Status: AC | PRN
  Administered 2021-07-11: 9 mL via INTRAVENOUS

## 2021-07-11 MED ORDER — LACTATED RINGERS IV SOLN: INTRAVENOUS | Status: DC

## 2021-07-11 NOTE — ED Notes (Signed)
Patient on phone with daughter at this time

## 2021-07-11 NOTE — ED Notes (Signed)
Neurologist at bedside. 
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
## 2021-07-11 NOTE — ED Triage Notes (Signed)
Patient arrived by EMS from home for weakness and inability to ambulate. Reports she has a transporting wheelchair at home that someone wheels her from room to room when available. Patient reports due to lack of help/resources she has not been able to go to any appointments to figure out what is going on. Reports it has been about 3 weeks since she started falling often and now unable to ambulate at all. A&O x4

## 2021-07-11 NOTE — ED Notes (Signed)
Upon my arrival pt was soiled with urine. Pt soiled clothes removed and placed in bag. Linens changed, pt cleaned, a pad placed on bed and brief with purewick and gown placed on pt. Pt repositioned, all  belongings within reach, warm blankets applied.
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
## 2021-07-11 NOTE — ED Notes (Signed)
Neurologist in room assessing patient.
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
## 2021-07-12 DIAGNOSIS — M3320 Polymyositis, organ involvement unspecified: Secondary | ICD-10-CM

## 2021-07-12 DIAGNOSIS — M6282 Rhabdomyolysis: Principal | ICD-10-CM

## 2021-07-12 DIAGNOSIS — R7401 Elevation of levels of liver transaminase levels: Secondary | ICD-10-CM

## 2021-07-12 DIAGNOSIS — F4310 Post-traumatic stress disorder, unspecified: Secondary | ICD-10-CM

## 2021-07-12 LAB — COMPREHENSIVE METABOLIC PANEL
ALT: 386 U/L — ABNORMAL HIGH (ref 0–44)
AST: 925 U/L — ABNORMAL HIGH (ref 15–41)
Albumin: 3 g/dL — ABNORMAL LOW (ref 3.5–5.0)
Alkaline Phosphatase: 44 U/L (ref 38–126)
Anion gap: 15 (ref 5–15)
BUN: 6 mg/dL (ref 6–20)
CO2: 22 mmol/L (ref 22–32)
Calcium: 8.8 mg/dL — ABNORMAL LOW (ref 8.9–10.3)
Chloride: 102 mmol/L (ref 98–111)
Creatinine, Ser: 0.45 mg/dL (ref 0.44–1.00)
GFR, Estimated: >60 mL/min (ref >60)
Glucose, Bld: 66 mg/dL — ABNORMAL LOW (ref 70–99)
Potassium: 3.5 mmol/L (ref 3.5–5.1)
Sodium: 139 mmol/L (ref 135–145)
Total Bilirubin: 1.7 mg/dL — ABNORMAL HIGH (ref 0.3–1.2)
Total Protein: 6.4 g/dL — ABNORMAL LOW (ref 6.5–8.1)

## 2021-07-12 LAB — CBC
HCT: 34.2 % — ABNORMAL LOW (ref 36.0–46.0)
Hemoglobin: 11.2 g/dL — ABNORMAL LOW (ref 12.0–15.0)
MCH: 28.9 pg (ref 26.0–34.0)
MCHC: 32.7 g/dL (ref 30.0–36.0)
MCV: 88.1 fL (ref 80.0–100.0)
Platelets: 285 10*3/uL (ref 150–400)
RBC: 3.88 MIL/uL (ref 3.87–5.11)
RDW: 16 % — ABNORMAL HIGH (ref 11.5–15.5)
WBC: 7.2 10*3/uL (ref 4.0–10.5)
nRBC: 0 % (ref 0.0–0.2)

## 2021-07-12 LAB — LACTATE DEHYDROGENASE: LDH: 2885 U/L — ABNORMAL HIGH (ref 98–192)

## 2021-07-12 LAB — GLUCOSE, CAPILLARY: Glucose-Capillary: 72 mg/dL (ref 70–99)

## 2021-07-12 LAB — HEPATITIS PANEL, ACUTE
HCV Ab: NONREACTIVE
Hep A IgM: NONREACTIVE
Hep B C IgM: NONREACTIVE
Hepatitis B Surface Ag: NONREACTIVE

## 2021-07-12 LAB — HIV ANTIBODY (ROUTINE TESTING W REFLEX): HIV Screen 4th Generation wRfx: NONREACTIVE

## 2021-07-12 LAB — C-REACTIVE PROTEIN: CRP: 11.2 mg/dL — ABNORMAL HIGH (ref <1.0)

## 2021-07-12 LAB — CK: Total CK: 3477 U/L — ABNORMAL HIGH (ref 38–234)

## 2021-07-12 MED ORDER — MAGIC MOUTHWASH W/LIDOCAINE
5.0000 mL | Freq: Three times a day (TID) | ORAL | Status: AC
  Administered 2021-07-12 – 2021-07-14 (×5): 5 mL via ORAL
  Filled 2021-07-12 (×5): qty 5

## 2021-07-12 MED ORDER — PREDNISONE 50 MG PO TABS
60.0000 mg | ORAL_TABLET | Freq: Every day | ORAL | Status: DC
  Administered 2021-07-12 – 2021-07-13 (×2): 60 mg via ORAL
  Filled 2021-07-12 (×2): qty 1

## 2021-07-12 MED ORDER — AMLODIPINE BESYLATE 5 MG PO TABS
5.0000 mg | ORAL_TABLET | Freq: Every day | ORAL | Status: DC
  Administered 2021-07-12 – 2021-07-14 (×3): 5 mg via ORAL
  Filled 2021-07-12 (×3): qty 1

## 2021-07-12 NOTE — Progress Notes (Signed)
PROGRESS NOTE  [name]    DOB: 1970-08-07, 50 y.o.  ZOX:[id number]  PCP: [name], MD   Code Status: Full Code   DOA: 07/11/2021   LOS: 1  Brief Narrative of Current Hospitalization  [name] is a 50 y.o. female with a PMH significant for SCC 2008, [place] 2007, breast cancer 2015 in remission, fibromyalgia. They presented from home to the ED on 07/11/2021 with progressive LE weakness which has worsened in past 3 weeks. Has now involved her upper extremities and neck. Progressed to the point that she is not able to ambulate any more. In the ED, it was found that they had elevated CK, transaminases. Neurology was consulted. Cervical spine MRI shows mild stenosis and signs of arthritis. They were treated with analgesia, IV fluids.  Patient was admitted to medicine service for further workup and management of progressive weakness as outlined in detail below.  07/12/21 -stable/improved  Assessment & Plan  Principal Problem:   Rhabdomyolysis  Weakness- cause unknown but potentially autoimmune disorder such as dermatomyositis which is supported by elevated CK (8,119>1,478) - neurology following, appreciate recs - follow CK - continue IV fluids - PT/OT - IV steroids  Neuro- fibromyalgia  neuropathy  PTSD-  - continue home clonazepam, gabapentin, seroquel, sertraline  Asthma-  - continue home symbicort, albuterol, zyrtec  DVT prophylaxis:   Diet:  Diet Orders (From admission, onward)     Start     Ordered   07/11/21 1415  Diet regular Room service appropriate? Yes; Fluid consistency: Thin  Diet effective now       Question Answer Comment  Room service appropriate? Yes   Fluid consistency: Thin      07/11/21 1415            Subjective 07/12/21    Pt reports feeling improved. Worried about what is going on with her body currently. Doesn't feel that she can walk currently. Denies respiratory distress  Disposition Plan & Communication  Patient status:  Inpatient  Admitted From: Home Disposition: Home Anticipated discharge date: TBD  Family Communication: none  Consults, Procedures, Significant Events  Consultants:  neurology  Procedures/significant events:  None  Antimicrobials:  Anti-infectives (From admission, onward)    None       Objective   Vitals:   07/11/21 1730 07/11/21 1918 07/11/21 2110 07/12/21 0340  BP: (!) 140/93 (!) 144/84 (!) 159/93 (!) 156/81  Pulse: (!) 101 (!) 102 97 95  Resp: 16  16 16   Temp:  98.5 F (36.9 C) 98.3 F (36.8 C) 98 F (36.7 C)  TempSrc:  Oral Oral Oral  SpO2: 100% 96% 100% 100%  Weight:      Height:        Intake/Output Summary (Last 24 hours) at 07/12/2021 0554 Last data filed at 07/12/2021 0031 Gross per 24 hour  Intake 1559.85 ml  Output 800 ml  Net 759.85 ml   Filed Weights   07/11/21 1019  Weight: 95.3 kg    Patient BMI: Body mass index is 37.2 kg/m.   Physical Exam: General: awake, alert, NAD Respiratory: normal respiratory effort. Cardiovascular: normal S1/S2, RRR, no JVD, murmurs, quick capillary refill  Nervous: A&O x3. no gross focal neurologic deficits, normal speech.  Extremities: moves all equally but gingerly, no edema, decreased tone Skin: dry, intact, normal temperature, normal color. No rashes, lesions or ulcers on exposed skin Psychiatry: normal mood, congruent affect  Labs   I have personally reviewed following labs and imaging  studies Admission on 07/11/2021  Component Date Value Ref Range Status   WBC 07/11/2021 8.4  4.0 - 10.5 K/uL Final   RBC 07/11/2021 4.02  3.87 - 5.11 MIL/uL Final   Hemoglobin 07/11/2021 11.9 (L)  12.0 - 15.0 g/dL Final   HCT 07/11/2021 35.2 (L)  36.0 - 46.0 % Final   MCV 07/11/2021 87.6  80.0 - 100.0 fL Final   MCH 07/11/2021 29.6  26.0 - 34.0 pg Final   MCHC 07/11/2021 33.8  30.0 - 36.0 g/dL Final   RDW 07/11/2021 16.2 (H)  11.5 - 15.5 % Final   Platelets 07/11/2021 290  150 - 400 K/uL Final   nRBC 07/11/2021 0.0   0.0 - 0.2 % Final   Sodium 07/11/2021 136  135 - 145 mmol/L Final   Potassium 07/11/2021 4.0  3.5 - 5.1 mmol/L Final   Chloride 07/11/2021 98  98 - 111 mmol/L Final   CO2 07/11/2021 26  22 - 32 mmol/L Final   Glucose, Bld 07/11/2021 88  70 - 99 mg/dL Final   BUN 07/11/2021 10  6 - 20 mg/dL Final   Creatinine, Ser 07/11/2021 0.43 (L)  0.44 - 1.00 mg/dL Final   Calcium 07/11/2021 8.9  8.9 - 10.3 mg/dL Final   Total Protein 07/11/2021 7.0  6.5 - 8.1 g/dL Final   Albumin 07/11/2021 3.3 (L)  3.5 - 5.0 g/dL Final   AST 07/11/2021 1,392 (H)  15 - 41 U/L Final   ALT 07/11/2021 451 (H)  0 - 44 U/L Final   Alkaline Phosphatase 07/11/2021 51  38 - 126 U/L Final   Total Bilirubin 07/11/2021 1.3 (H)  0.3 - 1.2 mg/dL Final   GFR, Estimated 07/11/2021 >60  >60 mL/min Final   Anion gap 07/11/2021 12  5 - 15 Final   Sed Rate 07/11/2021 67 (H)  0 - 30 mm/hr Final   Total CK 07/11/2021 4,799 (H)  38 - 234 U/L Final   Color, Urine 07/11/2021 YELLOW  YELLOW Final   APPearance 07/11/2021 CLEAR (A)  CLEAR Final   Specific Gravity, Urine 07/11/2021 1.015  1.005 - 1.030 Final   pH 07/11/2021 6.0  5.0 - 8.0 Final   Glucose, UA 07/11/2021 NEGATIVE  NEGATIVE mg/dL Final   Hgb urine dipstick 07/11/2021 MODERATE (A)  NEGATIVE Final   Bilirubin Urine 07/11/2021 SMALL (A)  NEGATIVE Final   Ketones, ur 07/11/2021 80 (A)  NEGATIVE mg/dL Final   Protein, ur 07/11/2021 NEGATIVE  NEGATIVE mg/dL Final   Nitrite 07/11/2021 NEGATIVE  NEGATIVE Final   Leukocytes,Ua 07/11/2021 NEGATIVE  NEGATIVE Final   RBC / HPF 07/11/2021 0-5  0 - 5 RBC/hpf Final   WBC, UA 07/11/2021 0-5  0 - 5 WBC/hpf Final   Bacteria, UA 07/11/2021 RARE (A)  NONE SEEN Final   Squamous Epithelial / LPF 07/11/2021 0-5  0 - 5 Final   Mucus 07/11/2021 PRESENT   Final   Hyaline Casts, UA 07/11/2021 PRESENT   Final   Hepatitis B Surface Ag 07/11/2021 NON REACTIVE  NON REACTIVE Final   HCV Ab 07/11/2021 NON REACTIVE  NON REACTIVE Final   Hep A IgM  07/11/2021 NON REACTIVE  NON REACTIVE Final   Hep B C IgM 07/11/2021 NON REACTIVE  NON REACTIVE Final   aPTT 07/11/2021 29  24 - 36 seconds Final   Prothrombin Time 07/11/2021 12.6  11.4 - 15.2 seconds Final   INR 07/11/2021 0.9  0.8 - 1.2 Final   Sodium 07/12/2021 139  135 - 145 mmol/L Final   Potassium 07/12/2021 3.5  3.5 - 5.1 mmol/L Final   Chloride 07/12/2021 102  98 - 111 mmol/L Final   CO2 07/12/2021 22  22 - 32 mmol/L Final   Glucose, Bld 07/12/2021 66 (L)  70 - 99 mg/dL Final   BUN 07/12/2021 6  6 - 20 mg/dL Final   Creatinine, Ser 07/12/2021 0.45  0.44 - 1.00 mg/dL Final   Calcium 07/12/2021 8.8 (L)  8.9 - 10.3 mg/dL Final   Total Protein 07/12/2021 6.4 (L)  6.5 - 8.1 g/dL Final   Albumin 07/12/2021 3.0 (L)  3.5 - 5.0 g/dL Final   AST 07/12/2021 925 (H)  15 - 41 U/L Final   ALT 07/12/2021 386 (H)  0 - 44 U/L Final   Alkaline Phosphatase 07/12/2021 44  38 - 126 U/L Final   Total Bilirubin 07/12/2021 1.7 (H)  0.3 - 1.2 mg/dL Final   GFR, Estimated 07/12/2021 >60  >60 mL/min Final   Anion gap 07/12/2021 15  5 - 15 Final   WBC 07/12/2021 7.2  4.0 - 10.5 K/uL Final   RBC 07/12/2021 3.88  3.87 - 5.11 MIL/uL Final   Hemoglobin 07/12/2021 11.2 (L)  12.0 - 15.0 g/dL Final   HCT 07/12/2021 34.2 (L)  36.0 - 46.0 % Final   MCV 07/12/2021 88.1  80.0 - 100.0 fL Final   MCH 07/12/2021 28.9  26.0 - 34.0 pg Final   MCHC 07/12/2021 32.7  30.0 - 36.0 g/dL Final   RDW 07/12/2021 16.0 (H)  11.5 - 15.5 % Final   Platelets 07/12/2021 285  150 - 400 K/uL Final   nRBC 07/12/2021 0.0  0.0 - 0.2 % Final   Total CK 07/12/2021 3,477 (H)  38 - 234 U/L Final   SARS Coronavirus 2 by RT PCR 07/11/2021 NEGATIVE  NEGATIVE Final   Influenza A by PCR 07/11/2021 NEGATIVE  NEGATIVE Final   Influenza B by PCR 07/11/2021 NEGATIVE  NEGATIVE Final    Imaging Studies  MR Cervical Spine W or Wo Contrast  Result Date: 07/11/2021 CLINICAL DATA:  Acute cervical myelopathy. History of squamous cell carcinoma, basal  cell carcinoma, breast cancer. EXAM: MRI CERVICAL SPINE WITHOUT AND WITH CONTRAST TECHNIQUE: Multiplanar and multiecho pulse sequences of the cervical spine, to include the craniocervical junction and cervicothoracic junction, were obtained without and with intravenous contrast. CONTRAST:  76mL GADAVIST GADOBUTROL 1 MMOL/ML IV SOLN COMPARISON:  None. FINDINGS: Alignment: Normal alignment with straightening of the cervical lordosis Vertebrae: Negative for fracture or mass Cord: Normal signal and morphology.  No enhancing cord lesion. Posterior Fossa, vertebral arteries, paraspinal tissues: Negative Disc levels: C2-3: Negative for stenosis C3-4: Mild left foraminal narrowing due to uncinate spurring. Spinal canal normal in size C4-5: Moderate left foraminal narrowing due to asymmetric uncinate spurring on the left. Mild cord flattening and mild spinal stenosis due to spurring C5-6: Moderate left foraminal narrowing due to asymmetric spurring. Mild cord flattening and mild spinal stenosis C6-7: Mild left foraminal narrowing due to spurring C7-T1: Negative IMPRESSION: Left foraminal narrowing C3-4, C4-5, C5-6, C6-7 due to asymmetric spurring. Mild spinal stenosis at C4-5 and C5-6. Electronically Signed   By: Franchot Gallo M.D.   On: 07/11/2021 13:02   US Abdomen Limited RUQ (LIVER/GB)  Result Date: 07/11/2021 CLINICAL DATA:  Transaminitis EXAM: ULTRASOUND ABDOMEN LIMITED RIGHT UPPER QUADRANT COMPARISON:  10/28/2016 FINDINGS: Gallbladder: No gallstones or wall thickening visualized. No sonographic Murphy sign noted by sonographer. Common bile duct: Diameter:  5 mm Liver: Heterogeneous increased liver echotexture compatible with hepatic steatosis. There is scattered areas of focal fatty sparing. No other focal liver abnormalities or intrahepatic biliary duct dilation. Portal vein is patent on color Doppler imaging with normal direction of blood flow towards the liver. Other: None. IMPRESSION: 1. Stable hepatic  steatosis. 2. Otherwise unremarkable exam. Electronically Signed   By: Randa Ngo M.D.   On: 07/11/2021 17:46    Medications   Scheduled Meds:  clonazePAM  1 mg Oral QHS   cyclobenzaprine  10 mg Oral TID   enoxaparin (LOVENOX) injection  0.5 mg/kg Subcutaneous Q24H   fluticasone furoate-vilanterol  1 puff Inhalation Daily   gabapentin  300 mg Oral BID   influenza vac split quadrivalent PF  0.5 mL Intramuscular Tomorrow-1000   loratadine  10 mg Oral Daily   montelukast  10 mg Oral QHS   morphine  15 mg Oral Q12H   pantoprazole  40 mg Oral BID AC   sertraline  300 mg Oral Daily   [START ON 07/14/2021] Vitamin D (Ergocalciferol)  50,000 Units Oral Q Mon   No recently discontinued medications to reconcile  LOS: 1 day   Time spent: >63min  Athenia Rys L Chris Cripps, DO [facility] 07/12/2021, 5:54 AM   Available by Epic secure chat 7AM-7PM. If 7PM-7AM, please contact night-coverage Refer to [email or web address] to contact the [facility] Attending or Consulting provider for this pt

## 2021-07-12 NOTE — Evaluation (Signed)
Physical Therapy Evaluation Patient Details Name: [name] MRN: [id number] DOB: 11-27-1970 Today's Date: 07/12/2021  History of Present Illness  50 yo Female came to ED on 07/11/21 with progressive weakness and immobility. Patient reports progressive weakness and pain in BLE over last 3 weeks. She reports multiple falls (5+) with difficulty moving throughout home. PMH significant for squamous cell carcinoma in 2007, Breast CA 2015 in remission, Fibromyalgia; Patient was found to have elevated CPK levels; MRI negative for cord pathology;  Patient was diagnosed with rhabdomyolysis and given fluids.  Clinical Impression  50 yo Female reports worsening weakness in BLE over last few weeks. She lives at home with her 23 year old daughter. She reports her daughter goes to school and therefore she is home alone during the day. She is unable to cook/clean. She reports they often order food out for delivery. Patient reports multiple falls in last few weeks as a result of weakness. She also reports increased pain in BLE. During PT evaluation, RN came in and provided pain meds (unbilled); Patient does exhibit weakness in BLE (see below). She required min-mod A for bed mobility. Patient was able to transfer sit<>Stand with elevated bed requiring max A +1. She was unable to stand for >1-2 sec due to pain in BLE and weakness. Patient would benefit from skilled PT Intervention to improve LE strength and mobility. Recommend SNF rehab upon discharge based on patient's current mobility. She does have 2 steps to enter house and limited caregiver support. She states that she is hoping to get her home modified for handicap access (put in a ramp) and would like to get a wheelchair so she can be more independent in her home.      Recommendations for follow up therapy are one component of a multi-disciplinary discharge planning process, led by the attending physician.  Recommendations may be updated based on patient  status, additional functional criteria and insurance authorization.  Follow Up Recommendations Skilled nursing-short term rehab (<3 hours/day)    Assistance Recommended at Discharge Frequent or constant Supervision/Assistance  Functional Status Assessment Patient has had a recent decline in their functional status and demonstrates the ability to make significant improvements in function in a reasonable and predictable amount of time.  Equipment Recommendations  Wheelchair (measurements PT);Wheelchair cushion (measurements PT)    Recommendations for Other Services Rehab consult     Precautions / Restrictions Precautions Precautions: Fall Restrictions Weight Bearing Restrictions: No      Mobility  Bed Mobility Overal bed mobility: Needs Assistance Bed Mobility: Supine to Sit;Sit to Supine     Supine to sit: Min assist Sit to supine: Mod assist   General bed mobility comments: Required bed rails, increased effort and time; Required assistance to lift LE into bed transitioning sit to supine;    Transfers Overall transfer level: Needs assistance Equipment used: Rolling walker (2 wheels) Transfers: Sit to/from Stand Sit to Stand: Max assist           General transfer comment: from elevated bed, required cues for forward weight shift; has difficulty weight bearing into LE due to pain; unable to stand for >1 sec due to weakness and pain;    Ambulation/Gait               General Gait Details: unable at this time;  Stairs            Wheelchair Mobility    Modified Rankin (Stroke Patients Only)       Balance Overall  balance assessment: Needs assistance Sitting-balance support: Bilateral upper extremity supported;Feet supported Sitting balance-Leahy Scale: Good     Standing balance support: During functional activity;Bilateral upper extremity supported Standing balance-Leahy Scale: Zero Standing balance comment: requires max A +1 with RW in standing  due to weakness and pain;                             Pertinent Vitals/Pain Pain Assessment: 0-10 Pain Score: 8  Pain Location: BLE Pain Descriptors / Indicators: Aching;Sore Pain Intervention(s): Limited activity within patient's tolerance;Patient requesting pain meds-RN notified;RN gave pain meds during session    Home Living Family/patient expects to be discharged to:: Private residence Living Arrangements: Children;Other (Comment) Available Help at Discharge: Other (Comment) Type of Home: House Home Access: Stairs to enter Entrance Stairs-Rails: None Entrance Stairs-Number of Steps: 2   Home Layout: One level Home Equipment: Rollator (4 wheels);Transport chair;BSC/3in1;Tub bench Additional Comments: Pt's 81 year old daughter lives with her. Her daughter does all cleaning and some cooking. Pt reports they often order food out. Patient is unable to do any housework or cooking; She has no other help at home.    Prior Function Prior Level of Function : History of Falls (last six months);Driving;Needs assist       Physical Assist : Mobility (physical)     Mobility Comments: reports unable to use cane/walker as they made her more unsteady; often had to have seated rest breaks and used transport chair to get to bathroom; ADLs Comments: required increased time with rest breaks;     Hand Dominance   Dominant Hand: Right    Extremity/Trunk Assessment   Upper Extremity Assessment Upper Extremity Assessment: Generalized weakness RUE Coordination: WNL LUE Coordination: WNL    Lower Extremity Assessment Lower Extremity Assessment: RLE deficits/detail;LLE deficits/detail RLE Deficits / Details: AROM is limited against gravity, strength: hip: flexion: 2/5     knee: extension: 3-/5, flexion: 3/5     ankle: DF 3/5, PF: 2/5; has pain with all movement; RLE Sensation: WNL RLE Coordination: WNL LLE Deficits / Details: AROM is limited against gravity, strength: hip:  flexion: 2/5     knee: extension: 3-/5, flexion: 3/5     ankle: DF 3/5, PF: 2/5; has pain with all movement LLE Sensation: WNL LLE Coordination: WNL    Cervical / Trunk Assessment Cervical / Trunk Assessment: Kyphotic  Communication   Communication: No difficulties  Cognition Arousal/Alertness: Awake/alert Behavior During Therapy: WFL for tasks assessed/performed Overall Cognitive Status: Within Functional Limits for tasks assessed                                 General Comments: oriented x4        General Comments General comments (skin integrity, edema, etc.): no swelling noted; no wounds;    Exercises Other Exercises Other Exercises: Instructed patient in supine exercise: ankle pumpx 5 reps, quad set 3 sec hold x5 reps, hip abduction/adduction x5 reps; Other Exercises: required cues to slow down LE movement and increase AROM for better strengthening; Patient does report discomfort but able to complete with good ROM and motor control;   Assessment/Plan    PT Assessment Patient needs continued PT services  PT Problem List Decreased strength;Decreased mobility;Decreased safety awareness;Decreased activity tolerance;Decreased balance;Pain       PT Treatment Interventions DME instruction;Therapeutic exercise;Wheelchair mobility training;Gait training;Balance training;Stair training;Functional mobility training;Therapeutic activities;Patient/family education  PT Goals (Current goals can be found in the Care Plan section)  Acute Rehab PT Goals Patient Stated Goal: To go home. PT Goal Formulation: With patient Time For Goal Achievement: 07/26/21 Potential to Achieve Goals: Fair    Frequency Min 2X/week   Barriers to discharge Inaccessible home environment;Decreased caregiver support has 2 steps to enter; has limited caregiver support- home alone during day;    Co-evaluation               AM-PAC PT "6 Clicks" Mobility  Outcome Measure Help needed  turning from your back to your side while in a flat bed without using bedrails?: A Little Help needed moving from lying on your back to sitting on the side of a flat bed without using bedrails?: A Lot Help needed moving to and from a bed to a chair (including a wheelchair)?: Total Help needed standing up from a chair using your arms (e.g., wheelchair or bedside chair)?: Total Help needed to walk in hospital room?: Total Help needed climbing 3-5 steps with a railing? : Total 6 Click Score: 9    End of Session Equipment Utilized During Treatment: Gait belt Activity Tolerance: Patient limited by pain Patient left: in bed;with bed alarm set;with call bell/phone within reach Nurse Communication: Mobility status PT Visit Diagnosis: Unsteadiness on feet (R26.81);Other abnormalities of gait and mobility (R26.89);Muscle weakness (generalized) (M62.81);Repeated falls (R29.6);Difficulty in walking, not elsewhere classified (R26.2)    Time: 6384-5364, Skilled time: 9:45-10:00, 10:14-10:48 PT Time Calculation (min) (ACUTE ONLY): 63 min   Charges:   PT Evaluation $PT Eval Moderate Complexity: 1 Mod PT Treatments $Therapeutic Exercise: 8-22 mins          Ashleyann Shoun PT, DPT 07/12/2021, 11:01 AM
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
## 2021-07-12 NOTE — Evaluation (Signed)
Occupational Therapy Evaluation Patient Details Name: [name] MRN: [id number] DOB: 12-Aug-1970 Today's Date: 07/12/2021   History of Present Illness 50 yo Female came to ED on 07/11/21 with progressive weakness and immobility. Patient reports progressive weakness and pain in BLE over last 3 weeks. She reports multiple falls (5+) with difficulty moving throughout home. PMH significant for squamous cell carcinoma in 2007, Breast CA 2015 in remission, Fibromyalgia; Patient was found to have elevated CPK levels; MRI negative for cord pathology;  Patient was diagnosed with rhabdomyolysis and given fluids.   Clinical Impression   Pt seen for OT evaluation this date in setting of acute hospitalization d/t rhabdo. Pt reports requiring IADL assistance from her daughter at baseline and occasional bathing assistance with increased need for help in recent 2-3 weeks d/t progressively getting weaker (ultimately using BSC she borrowed from a family member as she was unable to get to restroom). This date, pt presents with pain in BLE, decreased strength, decreased activity tolerance and general deconditioning, impacting her ability to safely and efficiently participate in ADLs/ADL mobility. Pt currently requiring: SETUP for seated UB ADLs, MOD/MAX A for seated LB ADLs d/t limited by LE pain, MIN A for sup<>sit and pt declines to stand on OT evaluation citing fatigue from PT. Pt will benefit from continued OT f/u in acute setting to increase safety and tolerance for ADLs. Anticipate she will require STR f/u as well to ensure safety before returning home.      Recommendations for follow up therapy are one component of a multi-disciplinary discharge planning process, led by the attending physician.  Recommendations may be updated based on patient status, additional functional criteria and insurance authorization.   Follow Up Recommendations  Skilled nursing-short term rehab (<3 hours/day)    Assistance  Recommended at Discharge Intermittent Supervision/Assistance  Functional Status Assessment  Patient has had a recent decline in their functional status and demonstrates the ability to make significant improvements in function in a reasonable and predictable amount of time.  Equipment Recommendations  BSC/3in1;Tub/shower seat;Other (comment) (w/c)    Recommendations for Other Services       Precautions / Restrictions Precautions Precautions: Fall Restrictions Weight Bearing Restrictions: No      Mobility Bed Mobility Overal bed mobility: Needs Assistance Bed Mobility: Supine to Sit;Sit to Supine     Supine to sit: Min assist Sit to supine: Min assist   General bed mobility comments: increased time, HOB elevated    Transfers                   General transfer comment: decliend to stand with OT citing fatigue from PT session      Balance Overall balance assessment: Needs assistance Sitting-balance support: Bilateral upper extremity supported;Feet supported Sitting balance-Leahy Scale: Good         Standing balance comment: deferred                           ADL either performed or assessed with clinical judgement   ADL                                         General ADL Comments: requries SETUP for seated UB ADLs, MOD/MAX A for seated LB ADLs d/t limited by LE pain, MIN A for sup<>sit and pt declines to stand on OT evaluation citing fatigue  from PT.     Vision Baseline Vision/History: 1 Wears glasses Patient Visual Report: No change from baseline       Perception     Praxis      Pertinent Vitals/Pain Pain Assessment: 0-10 Pain Score: 8  Pain Location: BLE Pain Descriptors / Indicators: Aching;Sore Pain Intervention(s): Limited activity within patient's tolerance;Monitored during session;Repositioned     Hand Dominance Right   Extremity/Trunk Assessment Upper Extremity Assessment Upper Extremity Assessment:  Generalized weakness (only shld ROM to 1/2 range) RUE Sensation: WNL RUE Coordination: WNL LUE Sensation: WNL LUE Coordination: WNL   Lower Extremity Assessment Lower Extremity Assessment: Defer to PT evaluation;Generalized weakness RLE Deficits / Details: AROM is limited against gravity, strength: hip: flexion: 2/5     knee: extension: 3-/5, flexion: 3/5     ankle: DF 3/5, PF: 2/5; has pain with all movement; RLE Sensation: WNL RLE Coordination: WNL LLE Deficits / Details: AROM is limited against gravity, strength: hip: flexion: 2/5     knee: extension: 3-/5, flexion: 3/5     ankle: DF 3/5, PF: 2/5; has pain with all movement LLE Sensation: WNL LLE Coordination: WNL       Communication Communication Communication: No difficulties   Cognition Arousal/Alertness: Awake/alert Behavior During Therapy: WFL for tasks assessed/performed Overall Cognitive Status: Within Functional Limits for tasks assessed                                 General Comments: oriented x4     General Comments       Exercises Other Exercises Other Exercises: OT ed with pt and family re: role of OT in acute setting and importance of OOB activity as well as how to mindfully tense core muscles to strengthen w/o causing strain to her back. Pt with moderate reception   Shoulder Instructions      Home Living Family/patient expects to be discharged to:: Private residence Living Arrangements: Children;Other (Comment) Available Help at Discharge: Other (Comment) Type of Home: House Home Access: Stairs to enter [employer] of Steps: 2 Entrance Stairs-Rails: None Home Layout: One level     Bathroom Shower/Tub: Teacher, early years/pre: Handicapped height     Home Equipment: Rollator (4 wheels);Transport chair;BSC/3in1;Tub bench   Additional Comments: Pt's 46 year old daughter lives with her. Her daughter does all cleaning and some cooking. Pt reports they often order  food out. Patient is unable to do any housework or cooking; She has no other help at home.      Prior Functioning/Environment Prior Level of Function : History of Falls (last six months);Driving;Needs assist       Physical Assist : Mobility (physical);ADLs (physical)     Mobility Comments: reports unable to use cane/walker as they made her more unsteady; often had to have seated rest breaks and used transport chair to get to bathroom; ADLs Comments: required increased time with rest breaks;        OT Problem List: Decreased activity tolerance;Decreased strength;Impaired balance (sitting and/or standing);Pain      OT Treatment/Interventions: Self-care/ADL training;Therapeutic exercise;DME and/or AE instruction;Therapeutic activities;Patient/family education;Balance training    OT Goals(Current goals can be found in the care plan section) Acute Rehab OT Goals Patient Stated Goal: to get stronger OT Goal Formulation: With patient Time For Goal Achievement: 07/26/21 Potential to Achieve Goals: Good  OT Frequency: Min 2X/week   Barriers to D/C:  Co-evaluation              AM-PAC OT "6 Clicks" Daily Activity     Outcome Measure Help from another person eating meals?: None Help from another person taking care of personal grooming?: A Little Help from another person toileting, which includes using toliet, bedpan, or urinal?: A Lot Help from another person bathing (including washing, rinsing, drying)?: A Lot Help from another person to put on and taking off regular upper body clothing?: A Little Help from another person to put on and taking off regular lower body clothing?: A Lot 6 Click Score: 16   End of Session Nurse Communication: Mobility status  Activity Tolerance: Patient tolerated treatment well Patient left: in bed;with call bell/phone within reach;with bed alarm set;with family/visitor present  OT Visit Diagnosis: Unsteadiness on feet  (R26.81);Pain Pain - part of body: Leg                Time: 5697-9480 OT Time Calculation (min): 24 min Charges:  OT General Charges $OT Visit: 1 Visit OT Evaluation $OT Eval Moderate Complexity: 1 Mod OT Treatments $Self Care/Home Management : 8-22 mins  Gerrianne Scale, MS, OTR/L ascom [phone] 07/12/21, 5:04 PM

## 2021-07-13 LAB — CK: Total CK: 1133 U/L — ABNORMAL HIGH (ref 38–234)

## 2021-07-13 MED ORDER — ACETAMINOPHEN 325 MG PO TABS
650.0000 mg | ORAL_TABLET | Freq: Four times a day (QID) | ORAL | Status: DC | PRN
  Filled 2021-07-13: qty 2

## 2021-07-13 MED ORDER — TRAZODONE HCL 50 MG PO TABS
25.0000 mg | ORAL_TABLET | Freq: Every evening | ORAL | Status: DC | PRN
  Administered 2021-07-13 – 2021-07-14 (×2): 25 mg via ORAL
  Filled 2021-07-13 (×2): qty 1

## 2021-07-13 MED ORDER — ALUM & MAG HYDROXIDE-SIMETH 200-200-20 MG/5ML PO SUSP: 30.0000 mL | ORAL | Status: DC | PRN

## 2021-07-13 NOTE — NC FL2 (Signed)
[place] LEVEL OF CARE SCREENING TOOL     IDENTIFICATION  Patient Name: [name] Birthdate: [birth year] Sex: female Admission Date (Current Location): 07/11/2021  [name] and [name] Number:  Engineering geologist and Address:  [name], [address] [place], [name]      Provider Number: [id number]  Attending Physician Name and Address:  Kathryn Osmond, MD  Relative Name and Phone Number:  [name] (Sister)   [phone] [facility])    Current Level of Care: Mooney Recommended Level of Care: [place] Prior Approval Number:    Date Approved/Denied:   PASRR Number: [id number]  Discharge Plan:      Current Diagnoses: Patient Active Problem List   Diagnosis Date Noted   Polymyositis ([place])    Transaminitis    Rhabdomyolysis 07/11/2021   Unstable angina ([place]) 08/28/2019   Fever 12/01/2016   Absence of breast 01/29/2015   Malignant neoplasm of breast ([place]) 12/20/2013   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   Obesity, unspecified 02/06/2013   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   Panic disorder 07/01/2011   PTSD (post-traumatic stress disorder) 07/01/2011   Post traumatic stress disorder 05/15/2011   ASTHMA, EXERCISE INDUCED 05/25/2007   GERD 05/25/2007   IRRITABLE BOWEL SYNDROME 05/25/2007   Fibromyalgia 05/25/2007    Orientation RESPIRATION BLADDER Height & Weight     Self, Time, Situation, Place  Normal External catheter Weight: 210 lb (95.3 kg) Height:  5\' 3"  (160 cm)  BEHAVIORAL SYMPTOMS/MOOD NEUROLOGICAL BOWEL NUTRITION STATUS        Diet (regular)  AMBULATORY STATUS COMMUNICATION OF NEEDS Skin   Extensive Assist Verbally Other (Comment) (MASD - groin)                       Personal Care Assistance Level of Assistance  Bathing, Feeding, Dressing Bathing Assistance: Maximum assistance Feeding assistance: Independent Dressing Assistance:  Maximum assistance     Functional Limitations Info             SPECIAL CARE FACTORS FREQUENCY  PT (By licensed PT), OT (By licensed OT)     PT Frequency: 5x per week OT Frequency: 5x per week            Contractures      Additional Factors Info  Code Status, Allergies Code Status Info: full Allergies Info: Fluoxetine, Aspirin, Pregabalin, Ibuprofen, Lamotrigine, Penicillins, Shrimp (Shellfish Allergy)           Current Medications (07/13/2021):  This is the current Mooney active medication list Current Facility-Administered Medications  Medication Dose Route Frequency Provider Last Rate Last Admin   albuterol (PROVENTIL) (2.5 MG/3ML) 0.083% nebulizer solution 2.5 mg  2.5 mg Inhalation Q6H PRN Imagene Sheller S, DO       amLODipine (NORVASC) tablet 5 mg  5 mg Oral Daily Kathryn Osmond, MD   5 mg at 07/12/21 1806   clonazePAM (KLONOPIN) tablet 1 mg  1 mg Oral QHS Imagene Sheller S, DO   1 mg at 07/12/21 2059   cyclobenzaprine (FLEXERIL) tablet 10 mg  10 mg Oral TID Imagene Sheller S, DO   10 mg at 07/12/21 2059   enoxaparin (LOVENOX) injection 47.5 mg  0.5 mg/kg Subcutaneous Q24H Imagene Sheller S, DO   47.5 mg at 07/12/21 1726   fluticasone furoate-vilanterol (BREO ELLIPTA) 200-25 MCG/ACT 1 puff  1 puff Inhalation Daily Imagene Sheller S, DO  gabapentin (NEURONTIN) capsule 300 mg  300 mg Oral BID Imagene Sheller S, DO   300 mg at 07/12/21 2059   influenza vac split quadrivalent PF (FLUARIX) injection 0.5 mL  0.5 mL Intramuscular Tomorrow-1000 Imagene Sheller S, DO       lactated ringers infusion   Intravenous Continuous Leslee Home, DO 75 mL/hr at 07/13/21 0536 Infusion Verify at 07/13/21 0536   linaclotide (LINZESS) capsule 145 mcg  145 mcg Oral Daily PRN Leslee Home, DO       loratadine (CLARITIN) tablet 10 mg  10 mg Oral Daily Imagene Sheller S, DO       magic mouthwash w/lidocaine  5 mL Oral TID Kathryn Osmond, MD   5 mL at 07/12/21 1833   montelukast  (SINGULAIR) tablet 10 mg  10 mg Oral QHS Imagene Sheller S, DO   10 mg at 07/12/21 2059   morphine (MS CONTIN) 12 hr tablet 15 mg  15 mg Oral Q12H Anwar, Bonnee Quin S, DO   15 mg at 07/12/21 2059   ondansetron (ZOFRAN) tablet 4 mg  4 mg Oral Q6H PRN Imagene Sheller S, DO       Or   ondansetron (ZOFRAN) injection 4 mg  4 mg Intravenous Q6H PRN Imagene Sheller S, DO   4 mg at 07/13/21 9357   oxyCODONE (Oxy IR/ROXICODONE) immediate release tablet 5 mg  5 mg Oral Q6H PRN Imagene Sheller S, DO   5 mg at 07/13/21 0522   pantoprazole (PROTONIX) EC tablet 40 mg  40 mg Oral BID AC Anwar, Shayan S, DO   40 mg at 07/12/21 1725   predniSONE (DELTASONE) tablet 60 mg  60 mg Oral Q breakfast Derek Jack, MD   60 mg at 07/12/21 1003   QUEtiapine (SEROQUEL) tablet 50 mg  50 mg Oral TID PRN Imagene Sheller S, DO   50 mg at 07/12/21 2059   sertraline (ZOLOFT) tablet 300 mg  300 mg Oral Daily Imagene Sheller S, DO   300 mg at 07/12/21 1003   [START ON 07/14/2021] Vitamin D (Ergocalciferol) (DRISDOL) capsule 50,000 Units  50,000 Units Oral Q Mon Anwar, Shayan S, DO       Facility-Administered Medications Ordered in Other Encounters  Medication Dose Route Frequency Provider Last Rate Last Admin   sodium chloride flush (NS) 0.9 % injection 3 mL  3 mL Intravenous Q12H Dionisio David, MD         Discharge Medications: Please see discharge summary for a list of discharge medications.  Relevant Imaging Results:  Relevant Lab Results:   Additional Information SS #: [id number]  [place], LCSW

## 2021-07-13 NOTE — TOC Initial Note (Signed)
Transition of Care [facility]) - Initial/Assessment Note    Patient Details  Name: [name] MRN: [id number] Date of Birth: 1970/09/30  [facility] CM/SW Contact:    Magnus Ivan, LCSW Phone Number: 07/13/2021, 9:22 AM  Clinical Narrative:            Spoke to patient regarding DC planning. Patient lives with her 50 year old daughter. PCP [name]. Pharmacy [facility]. Patient typically drives herself to appointments. No DME, HH, or SNF history. Patient has had 2 COVID vaccines. No boosters. Patient is agreeable to SNF recommendation. Is going to talk with her sisters to see if they have suggestions on where would be her top choice in SNF. CSW is starting work up .       Expected Discharge Plan: Skilled Nursing Facility Barriers to Discharge: Continued Medical Work up   Patient Goals and CMS Choice Patient states their goals for this hospitalization and ongoing recovery are:: SNF CMS Medicare.gov Compare Post Acute Care list provided to:: Patient Choice offered to / list presented to : Patient  Expected Discharge Plan and Services Expected Discharge Plan: [place] arrangements for the past 2 months: Single Family Home                                      Prior Living Arrangements/Services Living arrangements for the past 2 months: Single Family Home Lives with:: Minor Children Patient language and need for interpreter reviewed:: Yes Do you feel safe going back to the place where you live?: Yes      Need for Family Participation in Patient Care: Yes (Comment) Care giver support system in place?: Yes (comment)   Criminal Activity/Legal Involvement Pertinent to Current Situation/Hospitalization: No - Comment as needed  Activities of Daily Living Home Assistive Devices/Equipment: Wheelchair ADL Screening (condition at time of admission) Patient's cognitive ability adequate to safely complete daily activities?:  Yes Is the patient deaf or have difficulty hearing?: No Does the patient have difficulty seeing, even when wearing glasses/contacts?: No Does the patient have difficulty concentrating, remembering, or making decisions?: No Patient able to express need for assistance with ADLs?: Yes Does the patient have difficulty dressing or bathing?: Yes Independently performs ADLs?: No Communication: Independent Dressing (OT): Needs assistance Is this a change from baseline?: Change from baseline, expected to last >3 days Grooming: Independent Feeding: Independent Bathing: Needs assistance Is this a change from baseline?: Change from baseline, expected to last >3 days Toileting: Needs assistance Is this a change from baseline?: Change from baseline, expected to last >3days Walks in Home: Needs assistance Is this a change from baseline?: Change from baseline, expected to last >3 days Does the patient have difficulty walking or climbing stairs?: Yes Weakness of Legs: Both Weakness of Arms/Hands: None  Permission Sought/Granted Permission sought to share information with : Facility Sport and exercise psychologist, Family Supports Permission granted to share information with : Yes, Verbal Permission Granted     Permission granted to share info w AGENCY: SNFs        Emotional Assessment       Orientation: : Oriented to Self, Oriented to Place, Oriented to  Time, Oriented to Situation Alcohol / Substance Use: Not Applicable Psych Involvement: No (comment)  Admission diagnosis:  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.20] Rhabdomyolysis [M62.82] Transaminitis [R74.01] Patient Active Problem List   Diagnosis Date Noted  Polymyositis ([place])    Transaminitis    Rhabdomyolysis 07/11/2021   Unstable angina ([place]) 08/28/2019   Fever 12/01/2016   Absence of breast 01/29/2015   Malignant neoplasm of breast ([place]) 12/20/2013   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   Obesity, unspecified 02/06/2013   Ductal  carcinoma in situ of breast 09/28/2011   Panic disorder 07/01/2011   PTSD (post-traumatic stress disorder) 07/01/2011   Post traumatic stress disorder 05/15/2011   ASTHMA, EXERCISE INDUCED 05/25/2007   GERD 05/25/2007   IRRITABLE BOWEL SYNDROME 05/25/2007   Fibromyalgia 05/25/2007   PCP:  [name], MD Pharmacy[facility]   [id number] [place] [address] [place] [address] Phone: [phone] Fax: [phone]     Social Determinants of Health (SDOH) Interventions    Readmission Risk Interventions Readmission Risk Prevention Plan 07/13/2021  Transportation Screening Complete  PCP or Specialist Appt within 3-5 Days Complete  HRI or [place] Complete  Social Work Consult for [place] Planning/Counseling Complete  Palliative Care Screening Not Applicable  Medication Review Press photographer) Complete  Some recent data might be hidden

## 2021-07-13 NOTE — Progress Notes (Signed)
PROGRESS NOTE  [name]    DOB: 1971/08/03, 50 y.o.  YPP:[id number]  PCP: [name], MD   Code Status: Full Code   DOA: 07/11/2021   LOS: 2  Brief Narrative of Current Hospitalization  [name] is a 50 y.o. female with a PMH significant for SCC 2008, Filer 2007, breast cancer 2015 in remission, fibromyalgia. They presented from home to the ED on 07/11/2021 with progressive LE weakness which has worsened in past 3 weeks. Has now involved her upper extremities and neck. Progressed to the point that she is not able to ambulate any more. In the ED, it was found that they had elevated CK, transaminases. Neurology was consulted. Cervical spine MRI shows mild stenosis and signs of arthritis. They were treated with analgesia, IV fluids.  Patient was admitted to medicine service for further workup and management of progressive weakness as outlined in detail below.  07/13/21 -stable/improved  Assessment & Plan  Principal Problem:   Rhabdomyolysis Active Problems:   PTSD (post-traumatic stress disorder)   Polymyositis (HCC)   Transaminitis  Generalized Weakness- cause unknown but potentially autoimmune disorder such as dermatomyositis which is supported by elevated CK (4,799>3,477>1,133). Patient requiring SNF level care at discharge.  - neurology following, appreciate recs  - outpatient EMG, muscle Bx - follow CK - continue IV fluids - PT/OT - IV steroids  Neuro- fibromyalgia  neuropathy  PTSD-  - continue home clonazepam, gabapentin, seroquel, sertraline  Asthma-  - continue home symbicort, albuterol, zyrtec  DVT prophylaxis:   Diet:  Diet Orders (From admission, onward)     Start     Ordered   07/11/21 1415  Diet regular Room service appropriate? Yes; Fluid consistency: Thin  Diet effective now       Question Answer Comment  Room service appropriate? Yes   Fluid consistency: Thin      07/11/21 1415            Subjective 07/13/21    Pt reports  feeling stable today. She endorses a headache and continued generalized weakness.   Disposition Plan & Communication  Patient status: Inpatient  Admitted From: Home Disposition: SNF Anticipated discharge date: 12/13  Family Communication: none  Consults, Procedures, Significant Events  Consultants:   Neurology   Procedures/significant events:  None  Antimicrobials:  Anti-infectives (From admission, onward)    None       Objective   Vitals:   07/12/21 0751 07/12/21 1121 07/12/21 1711 07/12/21 2108  BP: (!) 146/74 (!) 168/83 (!) 182/100 (!) 160/101  Pulse: 98 (!) 103 93 94  Resp: 18 16 16 18   Temp: 98.4 F (36.9 C) 98.4 F (36.9 C) 98.1 F (36.7 C) (!) 97.5 F (36.4 C)  TempSrc: Oral Oral Oral Oral  SpO2: 98% 95% 94% 93%  Weight:      Height:        Intake/Output Summary (Last 24 hours) at 07/13/2021 0658 Last data filed at 07/13/2021 0536 Gross per 24 hour  Intake 2379.56 ml  Output 1950 ml  Net 429.56 ml    Filed Weights   07/11/21 1019  Weight: 95.3 kg    Patient BMI: Body mass index is 37.2 kg/m.   Physical Exam: General: awake, alert, NAD Respiratory: normal respiratory effort. Cardiovascular: normal S1/S2, RRR, no JVD, murmurs, quick capillary refill  Nervous: A&O x3. no gross focal neurologic deficits, normal speech.  Extremities: moves all equally but gingerly, no edema, decreased tone Skin: dry, intact, normal temperature, normal  color. No rashes, lesions or ulcers on exposed skin Psychiatry: normal mood, congruent affect  Labs   I have personally reviewed following labs and imaging studies Admission on 07/11/2021  Component Date Value Ref Range Status   WBC 07/11/2021 8.4  4.0 - 10.5 K/uL Final   RBC 07/11/2021 4.02  3.87 - 5.11 MIL/uL Final   Hemoglobin 07/11/2021 11.9 (L)  12.0 - 15.0 g/dL Final   HCT 07/11/2021 35.2 (L)  36.0 - 46.0 % Final   MCV 07/11/2021 87.6  80.0 - 100.0 fL Final   MCH 07/11/2021 29.6  26.0 - 34.0 pg Final    MCHC 07/11/2021 33.8  30.0 - 36.0 g/dL Final   RDW 07/11/2021 16.2 (H)  11.5 - 15.5 % Final   Platelets 07/11/2021 290  150 - 400 K/uL Final   nRBC 07/11/2021 0.0  0.0 - 0.2 % Final   Sodium 07/11/2021 136  135 - 145 mmol/L Final   Potassium 07/11/2021 4.0  3.5 - 5.1 mmol/L Final   Chloride 07/11/2021 98  98 - 111 mmol/L Final   CO2 07/11/2021 26  22 - 32 mmol/L Final   Glucose, Bld 07/11/2021 88  70 - 99 mg/dL Final   BUN 07/11/2021 10  6 - 20 mg/dL Final   Creatinine, Ser 07/11/2021 0.43 (L)  0.44 - 1.00 mg/dL Final   Calcium 07/11/2021 8.9  8.9 - 10.3 mg/dL Final   Total Protein 07/11/2021 7.0  6.5 - 8.1 g/dL Final   Albumin 07/11/2021 3.3 (L)  3.5 - 5.0 g/dL Final   AST 07/11/2021 1,392 (H)  15 - 41 U/L Final   ALT 07/11/2021 451 (H)  0 - 44 U/L Final   Alkaline Phosphatase 07/11/2021 51  38 - 126 U/L Final   Total Bilirubin 07/11/2021 1.3 (H)  0.3 - 1.2 mg/dL Final   GFR, Estimated 07/11/2021 >60  >60 mL/min Final   Anion gap 07/11/2021 12  5 - 15 Final   Sed Rate 07/11/2021 67 (H)  0 - 30 mm/hr Final   Total CK 07/11/2021 4,799 (H)  38 - 234 U/L Final   Color, Urine 07/11/2021 YELLOW  YELLOW Final   APPearance 07/11/2021 CLEAR (A)  CLEAR Final   Specific Gravity, Urine 07/11/2021 1.015  1.005 - 1.030 Final   pH 07/11/2021 6.0  5.0 - 8.0 Final   Glucose, UA 07/11/2021 NEGATIVE  NEGATIVE mg/dL Final   Hgb urine dipstick 07/11/2021 MODERATE (A)  NEGATIVE Final   Bilirubin Urine 07/11/2021 SMALL (A)  NEGATIVE Final   Ketones, ur 07/11/2021 80 (A)  NEGATIVE mg/dL Final   Protein, ur 07/11/2021 NEGATIVE  NEGATIVE mg/dL Final   Nitrite 07/11/2021 NEGATIVE  NEGATIVE Final   Leukocytes,Ua 07/11/2021 NEGATIVE  NEGATIVE Final   RBC / HPF 07/11/2021 0-5  0 - 5 RBC/hpf Final   WBC, UA 07/11/2021 0-5  0 - 5 WBC/hpf Final   Bacteria, UA 07/11/2021 RARE (A)  NONE SEEN Final   Squamous Epithelial / LPF 07/11/2021 0-5  0 - 5 Final   Mucus 07/11/2021 PRESENT   Final   Hyaline Casts, UA  07/11/2021 PRESENT   Final   Hepatitis B Surface Ag 07/11/2021 NON REACTIVE  NON REACTIVE Final   HCV Ab 07/11/2021 NON REACTIVE  NON REACTIVE Final   Hep A IgM 07/11/2021 NON REACTIVE  NON REACTIVE Final   Hep B C IgM 07/11/2021 NON REACTIVE  NON REACTIVE Final   HIV Screen 4th Generation wRfx 07/12/2021 Non Reactive  Non Reactive  Final   aPTT 07/11/2021 29  24 - 36 seconds Final   Prothrombin Time 07/11/2021 12.6  11.4 - 15.2 seconds Final   INR 07/11/2021 0.9  0.8 - 1.2 Final   Sodium 07/12/2021 139  135 - 145 mmol/L Final   Potassium 07/12/2021 3.5  3.5 - 5.1 mmol/L Final   Chloride 07/12/2021 102  98 - 111 mmol/L Final   CO2 07/12/2021 22  22 - 32 mmol/L Final   Glucose, Bld 07/12/2021 66 (L)  70 - 99 mg/dL Final   BUN 07/12/2021 6  6 - 20 mg/dL Final   Creatinine, Ser 07/12/2021 0.45  0.44 - 1.00 mg/dL Final   Calcium 07/12/2021 8.8 (L)  8.9 - 10.3 mg/dL Final   Total Protein 07/12/2021 6.4 (L)  6.5 - 8.1 g/dL Final   Albumin 07/12/2021 3.0 (L)  3.5 - 5.0 g/dL Final   AST 07/12/2021 925 (H)  15 - 41 U/L Final   ALT 07/12/2021 386 (H)  0 - 44 U/L Final   Alkaline Phosphatase 07/12/2021 44  38 - 126 U/L Final   Total Bilirubin 07/12/2021 1.7 (H)  0.3 - 1.2 mg/dL Final   GFR, Estimated 07/12/2021 >60  >60 mL/min Final   Anion gap 07/12/2021 15  5 - 15 Final   WBC 07/12/2021 7.2  4.0 - 10.5 K/uL Final   RBC 07/12/2021 3.88  3.87 - 5.11 MIL/uL Final   Hemoglobin 07/12/2021 11.2 (L)  12.0 - 15.0 g/dL Final   HCT 07/12/2021 34.2 (L)  36.0 - 46.0 % Final   MCV 07/12/2021 88.1  80.0 - 100.0 fL Final   MCH 07/12/2021 28.9  26.0 - 34.0 pg Final   MCHC 07/12/2021 32.7  30.0 - 36.0 g/dL Final   RDW 07/12/2021 16.0 (H)  11.5 - 15.5 % Final   Platelets 07/12/2021 285  150 - 400 K/uL Final   nRBC 07/12/2021 0.0  0.0 - 0.2 % Final   Total CK 07/12/2021 3,477 (H)  38 - 234 U/L Final   SARS Coronavirus 2 by RT PCR 07/11/2021 NEGATIVE  NEGATIVE Final   Influenza A by PCR 07/11/2021 NEGATIVE   NEGATIVE Final   Influenza B by PCR 07/11/2021 NEGATIVE  NEGATIVE Final   CRP 07/12/2021 11.2 (H)  <1.0 mg/dL Final   Glucose-Capillary 07/12/2021 72  70 - 99 mg/dL Final   LDH 07/12/2021 2,885 (H)  98 - 192 U/L Final   Total CK 07/13/2021 1,133 (H)  38 - 234 U/L Final    Imaging Studies  MR Cervical Spine W or Wo Contrast  Result Date: 07/11/2021 CLINICAL DATA:  Acute cervical myelopathy. History of squamous cell carcinoma, basal cell carcinoma, breast cancer. EXAM: MRI CERVICAL SPINE WITHOUT AND WITH CONTRAST TECHNIQUE: Multiplanar and multiecho pulse sequences of the cervical spine, to include the craniocervical junction and cervicothoracic junction, were obtained without and with intravenous contrast. CONTRAST:  86mL GADAVIST GADOBUTROL 1 MMOL/ML IV SOLN COMPARISON:  None. FINDINGS: Alignment: Normal alignment with straightening of the cervical lordosis Vertebrae: Negative for fracture or mass Cord: Normal signal and morphology.  No enhancing cord lesion. Posterior Fossa, vertebral arteries, paraspinal tissues: Negative Disc levels: C2-3: Negative for stenosis C3-4: Mild left foraminal narrowing due to uncinate spurring. Spinal canal normal in size C4-5: Moderate left foraminal narrowing due to asymmetric uncinate spurring on the left. Mild cord flattening and mild spinal stenosis due to spurring C5-6: Moderate left foraminal narrowing due to asymmetric spurring. Mild cord flattening and mild spinal stenosis C6-7:  Mild left foraminal narrowing due to spurring C7-T1: Negative IMPRESSION: Left foraminal narrowing C3-4, C4-5, C5-6, C6-7 due to asymmetric spurring. Mild spinal stenosis at C4-5 and C5-6. Electronically Signed   By: Franchot Gallo M.D.   On: 07/11/2021 13:02   US Abdomen Limited RUQ (LIVER/GB)  Result Date: 07/11/2021 CLINICAL DATA:  Transaminitis EXAM: ULTRASOUND ABDOMEN LIMITED RIGHT UPPER QUADRANT COMPARISON:  10/28/2016 FINDINGS: Gallbladder: No gallstones or wall thickening  visualized. No sonographic Murphy sign noted by sonographer. Common bile duct: Diameter: 5 mm Liver: Heterogeneous increased liver echotexture compatible with hepatic steatosis. There is scattered areas of focal fatty sparing. No other focal liver abnormalities or intrahepatic biliary duct dilation. Portal vein is patent on color Doppler imaging with normal direction of blood flow towards the liver. Other: None. IMPRESSION: 1. Stable hepatic steatosis. 2. Otherwise unremarkable exam. Electronically Signed   By: Randa Ngo M.D.   On: 07/11/2021 17:46    Medications   Scheduled Meds:  amLODipine  5 mg Oral Daily   clonazePAM  1 mg Oral QHS   cyclobenzaprine  10 mg Oral TID   enoxaparin (LOVENOX) injection  0.5 mg/kg Subcutaneous Q24H   fluticasone furoate-vilanterol  1 puff Inhalation Daily   gabapentin  300 mg Oral BID   influenza vac split quadrivalent PF  0.5 mL Intramuscular Tomorrow-1000   loratadine  10 mg Oral Daily   magic mouthwash w/lidocaine  5 mL Oral TID   montelukast  10 mg Oral QHS   morphine  15 mg Oral Q12H   pantoprazole  40 mg Oral BID AC   predniSONE  60 mg Oral Q breakfast   sertraline  300 mg Oral Daily   [START ON 07/14/2021] Vitamin D (Ergocalciferol)  50,000 Units Oral Q Mon   No recently discontinued medications to reconcile  LOS: 2 days   Time spent: >64min  Avanni Turnbaugh L Honi Name, DO [facility] 07/13/2021, 6:58 AM   Available by Epic secure chat 7AM-7PM. If 7PM-7AM, please contact night-coverage Refer to [email or web address] to contact the [facility] Attending or Consulting provider for this pt

## 2021-07-14 LAB — ANTI-JO 1 ANTIBODY, IGG: Anti JO-1: <0.2 AI (ref 0.0–0.9)

## 2021-07-14 LAB — ANA W/REFLEX IF POSITIVE: Anti Nuclear Antibody (ANA): NEGATIVE

## 2021-07-14 LAB — MYOGLOBIN, URINE: Myoglobin, Ur: 10 ng/mL (ref 0–13)

## 2021-07-14 LAB — CK: Total CK: 608 U/L — ABNORMAL HIGH (ref 38–234)

## 2021-07-14 LAB — ALDOLASE: Aldolase: 48.3 U/L — ABNORMAL HIGH (ref 3.3–10.3)

## 2021-07-14 MED ORDER — ZOLPIDEM TARTRATE 5 MG PO TABS
5.0000 mg | ORAL_TABLET | Freq: Every evening | ORAL | Status: DC | PRN
  Administered 2021-07-14: 5 mg via ORAL
  Filled 2021-07-14: qty 1
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
## 2021-07-14 NOTE — Progress Notes (Signed)
Physical Therapy Treatment Patient Details Name: [name] MRN: [id number] DOB: 1970/09/08 Today's Date: 07/14/2021   History of Present Illness 50 yo Female came to ED on 07/11/21 with progressive weakness and immobility. Patient reports progressive weakness and pain in BLE over last 3 weeks. She reports multiple falls (5+) with difficulty moving throughout home. PMH significant for squamous cell carcinoma in 2007, Breast CA 2015 in remission, Fibromyalgia; Patient was found to have elevated CPK levels; MRI negative for cord pathology;  Patient was diagnosed with rhabdomyolysis and given fluids.    PT Comments    Pt is making gradual progress towards goals with ability to sit at EOB and perform seated there-ex. Is very limited by pain and attempted, but unsuccessful at standing attempt. Lateral scoot pivot transfer to recliner with cga. Will continue to progress as able.  Recommendations for follow up therapy are one component of a multi-disciplinary discharge planning process, led by the attending physician.  Recommendations may be updated based on patient status, additional functional criteria and insurance authorization.  Follow Up Recommendations  Skilled nursing-short term rehab (<3 hours/day)     Assistance Recommended at Discharge Frequent or constant Supervision/Assistance  Equipment Recommendations  Wheelchair (measurements PT);Wheelchair cushion (measurements PT)    Recommendations for Other Services       Precautions / Restrictions Precautions Precautions: Fall Restrictions Weight Bearing Restrictions: No     Mobility  Bed Mobility Overal bed mobility: Needs Assistance Bed Mobility: Supine to Sit;Sit to Supine     Supine to sit: Min guard     General bed mobility comments: improved technique with less cues and physical assist required. Still keeps HOB elevated.    Transfers Overall transfer level: Needs assistance Equipment used: Rolling walker (2  wheels) Transfers: Sit to/from Stand;Bed to chair/wheelchair/BSC Sit to Stand: Total assist          Lateral/Scoot Transfers: Min guard General transfer comment: 1 attempt to perform sit<>Stand with max/total assist. Unable to weight shift onto B feet and no buttock lift off. Pt then able to perform lateral transfer with recliner arm lowered with cga to chair.    Ambulation/Gait               General Gait Details: not able   Stairs             Wheelchair Mobility    Modified Rankin (Stroke Patients Only)       Balance Overall balance assessment: Needs assistance Sitting-balance support: Bilateral upper extremity supported;Feet supported Sitting balance-Leahy Scale: Good                                      Cognition Arousal/Alertness: Awake/alert Behavior During Therapy: WFL for tasks assessed/performed Overall Cognitive Status: Within Functional Limits for tasks assessed                                 General Comments: oriented x4        Exercises Other Exercises Other Exercises: seated ther-ex performed on B LE including AP, heel raises, LAQ, hip add squeezes, and alt marching. Only able to tolerate 5 reps due to pain per exercise    General Comments        Pertinent Vitals/Pain Pain Assessment: 0-10 Pain Score: 10-Worst pain ever Pain Location: BLE Pain Descriptors / Indicators: Aching;Sore Pain Intervention(s): Limited  activity within patient's tolerance;Repositioned    Home Living                          Prior Function            PT Goals (current goals can now be found in the care plan section) Acute Rehab PT Goals Patient Stated Goal: To go home. PT Goal Formulation: With patient Time For Goal Achievement: 07/26/21 Potential to Achieve Goals: Fair Progress towards PT goals: Progressing toward goals    Frequency    Min 2X/week      PT Plan Current plan remains appropriate     Co-evaluation              AM-PAC PT "6 Clicks" Mobility   Outcome Measure  Help needed turning from your back to your side while in a flat bed without using bedrails?: A Little Help needed moving from lying on your back to sitting on the side of a flat bed without using bedrails?: A Little Help needed moving to and from a bed to a chair (including a wheelchair)?: A Lot Help needed standing up from a chair using your arms (e.g., wheelchair or bedside chair)?: Total Help needed to walk in hospital room?: Total Help needed climbing 3-5 steps with a railing? : Total 6 Click Score: 11    End of Session Equipment Utilized During Treatment: Gait belt Activity Tolerance: Patient limited by pain Patient left: in chair Nurse Communication: Mobility status PT Visit Diagnosis: Unsteadiness on feet (R26.81);Other abnormalities of gait and mobility (R26.89);Muscle weakness (generalized) (M62.81);Repeated falls (R29.6);Difficulty in walking, not elsewhere classified (R26.2)     Time: 9758-8325 PT Time Calculation (min) (ACUTE ONLY): 24 min  Charges:  $Therapeutic Exercise: 8-22 mins $Therapeutic Activity: 8-22 mins                     [name], PT, DPT [phone]    [name] 07/14/2021, 11:01 AM

## 2021-07-14 NOTE — Progress Notes (Signed)
Occupational Therapy Treatment Patient Details Name: [name] MRN: [id number] DOB: May 03, 1971 Today's Date: 07/14/2021   History of present illness 50 yo Female came to ED on 07/11/21 with progressive weakness and immobility. Patient reports progressive weakness and pain in BLE over last 3 weeks. She reports multiple falls (5+) with difficulty moving throughout home. PMH significant for squamous cell carcinoma in 2007, Breast CA 2015 in remission, Fibromyalgia; Patient was found to have elevated CPK levels; MRI negative for cord pathology;  Patient was diagnosed with rhabdomyolysis and given fluids.   OT comments  Chart reviewed, RN cleared pt for participation in OT tx session. Pt alert and oriented, agreeable to session. Tx session targeted progressing functional mobility, strength/endurance in setting of ADL completion in order to facilitate return to PLOF. Supine<>sit required CGA, attempted STS with RW 3x with facilitation provided buttock cleared bed approx 1 inch on 2 attempts. Grooming tasks completed in sitting with SET UP. Pt is making progress towards goal acquisition. Extensive education provided re: progression of rehab and dispo planning. All information accepted with questions answered. Pt is left as received, NAD, all needs met. RN aware of pt status. Continue to recommend discharge to STR to address functional deficits. OT will continue to follow while admitted.    Recommendations for follow up therapy are one component of a multi-disciplinary discharge planning process, led by the attending physician.  Recommendations may be updated based on patient status, additional functional criteria and insurance authorization.    Follow Up Recommendations  Skilled nursing-short term rehab (<3 hours/day)    Assistance Recommended at Discharge Intermittent Supervision/Assistance  Equipment Recommendations  BSC/3in1;Tub/shower seat    Recommendations for Other Services       Precautions / Restrictions Precautions Precautions: Fall Restrictions Weight Bearing Restrictions: No       Mobility Bed Mobility Overal bed mobility: Needs Assistance Bed Mobility: Supine to Sit;Sit to Supine     Supine to sit: Min guard Sit to supine: Min guard   General bed mobility comments: HOB elevated, bed rails    Transfers Overall transfer level: Needs assistance Equipment used: Rolling walker (2 wheels) Transfers: Sit to/from Stand Sit to Stand: Max assist (3 attempts)           General transfer comment: 3 attempts STS with MAX A- minimal buttock lift off noted 2x with facilitation.     Balance Overall balance assessment: Needs assistance Sitting-balance support: Bilateral upper extremity supported;Feet supported Sitting balance-Leahy Scale: Good Sitting balance - Comments: G static and dynamic sitting balance during ADL tasks                                   ADL either performed or assessed with clinical judgement   ADL Overall ADL's : Needs assistance/impaired Eating/Feeding: Independent   Grooming: Wash/dry hands;Wash/dry face;Oral care;Sitting;Set up           Upper Body Dressing : Minimal assistance                          Extremity/Trunk Assessment Upper Extremity Assessment Upper Extremity Assessment: Generalized weakness   Lower Extremity Assessment Lower Extremity Assessment: Generalized weakness        Vision Baseline Vision/History: 1 Wears glasses            Cognition Arousal/Alertness: Awake/alert Behavior During Therapy: WFL for tasks assessed/performed Overall Cognitive Status: Within Functional Limits for  tasks assessed                                 General Comments: alert and oriented x4          Exercises Other Exercises Other Exercises: edu: re role of OT, role of rehab moving forward, importance of continued activity           Pertinent Vitals/ Pain        Pain Assessment: 0-10 Pain Score: 10-Worst pain ever Pain Location: BLE Pain Descriptors / Indicators: Aching Pain Intervention(s): Limited activity within patient's tolerance;Repositioned;Monitored during session   Frequency  Min 2X/week        Progress Toward Goals  OT Goals(current goals can now be found in the care plan section)  Progress towards OT goals: Progressing toward goals  Acute Rehab OT Goals Patient Stated Goal: to go to rehab OT Goal Formulation: With patient Potential to Achieve Goals: Good  Plan Discharge plan remains appropriate       AM-PAC OT "6 Clicks" Daily Activity     Outcome Measure   Help from another person eating meals?: None Help from another person taking care of personal grooming?: A Little Help from another person toileting, which includes using toliet, bedpan, or urinal?: A Lot Help from another person bathing (including washing, rinsing, drying)?: A Lot Help from another person to put on and taking off regular upper body clothing?: A Little Help from another person to put on and taking off regular lower body clothing?: A Lot 6 Click Score: 16    End of Session Equipment Utilized During Treatment: Rolling walker (2 wheels)  OT Visit Diagnosis: Unsteadiness on feet (R26.81);Pain   Activity Tolerance Patient tolerated treatment well   Patient Left in bed;with call bell/phone within reach;with bed alarm set   Nurse Communication Mobility status        Time: 1423-1450 OT Time Calculation (min): 27 min  Charges: OT General Charges $OT Visit: 1 Visit OT Treatments $Self Care/Home Management : 8-22 mins $Therapeutic Activity: 8-22 mins  Shanon Payor, OTD OTR/L  07/14/21, 3:38 PM

## 2021-07-14 NOTE — TOC Progression Note (Addendum)
Transition of Care [facility]) - Progression Note    Patient Details  Name: ENGLAND GREB MRN: [id number] Date of Birth: 1971/06/21  Transition of Care [facility]) CM/SW [place], RN Phone Number: 07/14/2021, 3:42 PM  Clinical Narrative: Spoke with patient about bed offer for [facility], she approves. [facility] and Attending notified. TOC to assist with discharge when medically stable.  3:56pm Attending will work on discharge for tomorrow, Rapid covid test will be ordered today. Patient notified.     Expected Discharge Plan: [place] Barriers to Discharge: Continued Medical Work up  Expected Discharge Plan and Services Expected Discharge Plan: [place] arrangements for the past 2 months: Single Family Home                                       Social Determinants of Health (SDOH) Interventions    Readmission Risk Interventions Readmission Risk Prevention Plan 07/13/2021  Transportation Screening Complete  PCP or Specialist Appt within 3-5 Days Complete  HRI or [place] Complete  Social Work Consult for [place] Planning/Counseling Complete  Palliative Care Screening Not Applicable  Medication Review [employer]) Complete  Some recent data might be hidden

## 2021-07-14 NOTE — Care Management Important Message (Signed)
Important Message  Patient Details  Name: [name] MRN: [id number] Date of Birth: 1971-05-05   Medicare Important Message Given:  Yes     Dannette Barbara 07/14/2021, 11:45 AM

## 2021-07-14 NOTE — Progress Notes (Signed)
PROGRESS NOTE  [name]    DOB: 1971-01-16, 50 y.o.  SAY:[id number]  PCP: [name], MD   Code Status: Full Code   DOA: 07/11/2021   LOS: 3  Brief Narrative of Current Hospitalization  [name] is a 50 y.o. female with a PMH significant for SCC 2008, [place] 2007, breast cancer 2015 in remission, fibromyalgia. They presented from home to the ED on 07/11/2021 with progressive LE weakness which has worsened in past 3 weeks. Has now involved her upper extremities and neck. Progressed to the point that she is not able to ambulate any more. In the ED, it was found that they had elevated CK, transaminases. Neurology was consulted. Cervical spine MRI shows mild stenosis and signs of arthritis. They were treated with analgesia, IV fluids.  Patient was admitted to medicine service for further workup and management of progressive weakness as outlined in detail below.  07/14/21 -stable/improved  Assessment & Plan  Principal Problem:   Rhabdomyolysis Active Problems:   PTSD (post-traumatic stress disorder)   Polymyositis (HCC)   Transaminitis  Generalized Weakness- cause unknown but potentially autoimmune disorder such as dermatomyositis which is supported by elevated CK (4,[phone]). Patient requiring SNF level care at discharge. Statin has been discontinued as potential cause of rhabdomyolysis. - neurology following, appreciate recs  - outpatient EMG, muscle Bx - follow CK - continue IV fluids - PT/OT  Neuro- fibromyalgia  neuropathy  PTSD-  - continue home clonazepam, gabapentin, seroquel, sertraline  Asthma-  - continue home symbicort, albuterol, zyrtec  DVT prophylaxis:   Diet:  Diet Orders (From admission, onward)     Start     Ordered   07/11/21 1415  Diet regular Room service appropriate? Yes; Fluid consistency: Thin  Diet effective now       Question Answer Comment  Room service appropriate? Yes   Fluid consistency: Thin      07/11/21 1415             Subjective 07/14/21    Pt reports feeling the same today. No improvement in her strength. Denies pain or respiratory distress.   Disposition Plan & Communication  Patient status: Inpatient  Admitted From: Home Disposition: SNF Anticipated discharge date: 12/13. Medically stable for discharge to SNF once bed available.   Family Communication: none  Consults, Procedures, Significant Events  Consultants:   Neurology   Procedures/significant events:  None  Antimicrobials:  Anti-infectives (From admission, onward)    None       Objective   Vitals:   07/13/21 1207 07/13/21 2155 07/14/21 0009 07/14/21 0436  BP: (!) 178/91 (!) 169/101 (!) 137/100 (!) 146/87  Pulse: 91 97 99 80  Resp: 20 20  16   Temp: 98.2 F (36.8 C) 97.9 F (36.6 C)  98.3 F (36.8 C)  TempSrc:      SpO2: 97% 93%  94%  Weight:      Height:        Intake/Output Summary (Last 24 hours) at 07/14/2021 0657 Last data filed at 07/14/2021 0436 Gross per 24 hour  Intake 1875.6 ml  Output 1600 ml  Net 275.6 ml    Filed Weights   07/11/21 1019  Weight: 95.3 kg    Patient BMI: Body mass index is 37.2 kg/m.   Physical Exam: General: awake, alert, NAD Respiratory: normal respiratory effort. Cardiovascular: normal S1/S2, RRR, no JVD, murmurs, quick capillary refill  Nervous: A&O x3. no gross focal neurologic deficits, normal speech.  Extremities: moves all  equally but gingerly, no edema, decreased tone Skin: dry, intact, normal temperature, normal color. No rashes, lesions or ulcers on exposed skin Psychiatry: normal mood, congruent affect  Labs   I have personally reviewed following labs and imaging studies Admission on 07/11/2021  Component Date Value Ref Range Status   WBC 07/11/2021 8.4  4.0 - 10.5 K/uL Final   RBC 07/11/2021 4.02  3.87 - 5.11 MIL/uL Final   Hemoglobin 07/11/2021 11.9 (L)  12.0 - 15.0 g/dL Final   HCT 07/11/2021 35.2 (L)  36.0 - 46.0 % Final   MCV 07/11/2021 87.6  80.0 - 100.0  fL Final   MCH 07/11/2021 29.6  26.0 - 34.0 pg Final   MCHC 07/11/2021 33.8  30.0 - 36.0 g/dL Final   RDW 07/11/2021 16.2 (H)  11.5 - 15.5 % Final   Platelets 07/11/2021 290  150 - 400 K/uL Final   nRBC 07/11/2021 0.0  0.0 - 0.2 % Final   Sodium 07/11/2021 136  135 - 145 mmol/L Final   Potassium 07/11/2021 4.0  3.5 - 5.1 mmol/L Final   Chloride 07/11/2021 98  98 - 111 mmol/L Final   CO2 07/11/2021 26  22 - 32 mmol/L Final   Glucose, Bld 07/11/2021 88  70 - 99 mg/dL Final   BUN 07/11/2021 10  6 - 20 mg/dL Final   Creatinine, Ser 07/11/2021 0.43 (L)  0.44 - 1.00 mg/dL Final   Calcium 07/11/2021 8.9  8.9 - 10.3 mg/dL Final   Total Protein 07/11/2021 7.0  6.5 - 8.1 g/dL Final   Albumin 07/11/2021 3.3 (L)  3.5 - 5.0 g/dL Final   AST 07/11/2021 1,392 (H)  15 - 41 U/L Final   ALT 07/11/2021 451 (H)  0 - 44 U/L Final   Alkaline Phosphatase 07/11/2021 51  38 - 126 U/L Final   Total Bilirubin 07/11/2021 1.3 (H)  0.3 - 1.2 mg/dL Final   GFR, Estimated 07/11/2021 >60  >60 mL/min Final   Anion gap 07/11/2021 12  5 - 15 Final   Sed Rate 07/11/2021 67 (H)  0 - 30 mm/hr Final   Total CK 07/11/2021 4,799 (H)  38 - 234 U/L Final   Color, Urine 07/11/2021 YELLOW  YELLOW Final   APPearance 07/11/2021 CLEAR (A)  CLEAR Final   Specific Gravity, Urine 07/11/2021 1.015  1.005 - 1.030 Final   pH 07/11/2021 6.0  5.0 - 8.0 Final   Glucose, UA 07/11/2021 NEGATIVE  NEGATIVE mg/dL Final   Hgb urine dipstick 07/11/2021 MODERATE (A)  NEGATIVE Final   Bilirubin Urine 07/11/2021 SMALL (A)  NEGATIVE Final   Ketones, ur 07/11/2021 80 (A)  NEGATIVE mg/dL Final   Protein, ur 07/11/2021 NEGATIVE  NEGATIVE mg/dL Final   Nitrite 07/11/2021 NEGATIVE  NEGATIVE Final   Leukocytes,Ua 07/11/2021 NEGATIVE  NEGATIVE Final   RBC / HPF 07/11/2021 0-5  0 - 5 RBC/hpf Final   WBC, UA 07/11/2021 0-5  0 - 5 WBC/hpf Final   Bacteria, UA 07/11/2021 RARE (A)  NONE SEEN Final   Squamous Epithelial / LPF 07/11/2021 0-5  0 - 5 Final    Mucus 07/11/2021 PRESENT   Final   Hyaline Casts, UA 07/11/2021 PRESENT   Final   Hepatitis B Surface Ag 07/11/2021 NON REACTIVE  NON REACTIVE Final   HCV Ab 07/11/2021 NON REACTIVE  NON REACTIVE Final   Hep A IgM 07/11/2021 NON REACTIVE  NON REACTIVE Final   Hep B C IgM 07/11/2021 NON REACTIVE  NON REACTIVE Final  HIV Screen 4th Generation wRfx 07/12/2021 Non Reactive  Non Reactive Final   aPTT 07/11/2021 29  24 - 36 seconds Final   Prothrombin Time 07/11/2021 12.6  11.4 - 15.2 seconds Final   INR 07/11/2021 0.9  0.8 - 1.2 Final   Sodium 07/12/2021 139  135 - 145 mmol/L Final   Potassium 07/12/2021 3.5  3.5 - 5.1 mmol/L Final   Chloride 07/12/2021 102  98 - 111 mmol/L Final   CO2 07/12/2021 22  22 - 32 mmol/L Final   Glucose, Bld 07/12/2021 66 (L)  70 - 99 mg/dL Final   BUN 07/12/2021 6  6 - 20 mg/dL Final   Creatinine, Ser 07/12/2021 0.45  0.44 - 1.00 mg/dL Final   Calcium 07/12/2021 8.8 (L)  8.9 - 10.3 mg/dL Final   Total Protein 07/12/2021 6.4 (L)  6.5 - 8.1 g/dL Final   Albumin 07/12/2021 3.0 (L)  3.5 - 5.0 g/dL Final   AST 07/12/2021 925 (H)  15 - 41 U/L Final   ALT 07/12/2021 386 (H)  0 - 44 U/L Final   Alkaline Phosphatase 07/12/2021 44  38 - 126 U/L Final   Total Bilirubin 07/12/2021 1.7 (H)  0.3 - 1.2 mg/dL Final   GFR, Estimated 07/12/2021 >60  >60 mL/min Final   Anion gap 07/12/2021 15  5 - 15 Final   WBC 07/12/2021 7.2  4.0 - 10.5 K/uL Final   RBC 07/12/2021 3.88  3.87 - 5.11 MIL/uL Final   Hemoglobin 07/12/2021 11.2 (L)  12.0 - 15.0 g/dL Final   HCT 07/12/2021 34.2 (L)  36.0 - 46.0 % Final   MCV 07/12/2021 88.1  80.0 - 100.0 fL Final   MCH 07/12/2021 28.9  26.0 - 34.0 pg Final   MCHC 07/12/2021 32.7  30.0 - 36.0 g/dL Final   RDW 07/12/2021 16.0 (H)  11.5 - 15.5 % Final   Platelets 07/12/2021 285  150 - 400 K/uL Final   nRBC 07/12/2021 0.0  0.0 - 0.2 % Final   Total CK 07/12/2021 3,477 (H)  38 - 234 U/L Final   SARS Coronavirus 2 by RT PCR 07/11/2021 NEGATIVE   NEGATIVE Final   Influenza A by PCR 07/11/2021 NEGATIVE  NEGATIVE Final   Influenza B by PCR 07/11/2021 NEGATIVE  NEGATIVE Final   CRP 07/12/2021 11.2 (H)  <1.0 mg/dL Final   Glucose-Capillary 07/12/2021 72  70 - 99 mg/dL Final   LDH 07/12/2021 2,885 (H)  98 - 192 U/L Final   Total CK 07/13/2021 1,133 (H)  38 - 234 U/L Final   Total CK 07/14/2021 608 (H)  38 - 234 U/L Final    Imaging Studies  No results found.  Medications   Scheduled Meds:  amLODipine  5 mg Oral Daily   clonazePAM  1 mg Oral QHS   cyclobenzaprine  10 mg Oral TID   enoxaparin (LOVENOX) injection  0.5 mg/kg Subcutaneous Q24H   fluticasone furoate-vilanterol  1 puff Inhalation Daily   gabapentin  300 mg Oral BID   influenza vac split quadrivalent PF  0.5 mL Intramuscular Tomorrow-1000   loratadine  10 mg Oral Daily   magic mouthwash w/lidocaine  5 mL Oral TID   montelukast  10 mg Oral QHS   pantoprazole  40 mg Oral BID AC   sertraline  300 mg Oral Daily   Vitamin D (Ergocalciferol)  50,000 Units Oral Q Mon   No recently discontinued medications to reconcile  LOS: 3 days   Time spent: >  20min  Richarda Osmond, DO [facility] 07/14/2021, 6:57 AM   Available by Epic secure chat 7AM-7PM. If 7PM-7AM, please contact night-coverage Refer to [email or web address] to contact the [facility] Attending or Consulting provider for this pt

## 2021-07-15 DIAGNOSIS — G4731 Primary central sleep apnea: Secondary | ICD-10-CM | POA: Insufficient documentation

## 2021-07-15 DIAGNOSIS — E7800 Pure hypercholesterolemia, unspecified: Secondary | ICD-10-CM | POA: Insufficient documentation

## 2021-07-15 LAB — COMPREHENSIVE METABOLIC PANEL
ALT: 215 U/L — ABNORMAL HIGH (ref 0–44)
AST: 215 U/L — ABNORMAL HIGH (ref 15–41)
Albumin: 3.1 g/dL — ABNORMAL LOW (ref 3.5–5.0)
Alkaline Phosphatase: 53 U/L (ref 38–126)
Anion gap: 7 (ref 5–15)
BUN: 9 mg/dL (ref 6–20)
CO2: 32 mmol/L (ref 22–32)
Calcium: 9 mg/dL (ref 8.9–10.3)
Chloride: 101 mmol/L (ref 98–111)
Creatinine, Ser: <0.3 mg/dL — ABNORMAL LOW (ref 0.44–1.00)
Glucose, Bld: 100 mg/dL — ABNORMAL HIGH (ref 70–99)
Potassium: 3.8 mmol/L (ref 3.5–5.1)
Sodium: 140 mmol/L (ref 135–145)
Total Bilirubin: 0.5 mg/dL (ref 0.3–1.2)
Total Protein: 6.6 g/dL (ref 6.5–8.1)

## 2021-07-15 LAB — CK: Total CK: 759 U/L — ABNORMAL HIGH (ref 38–234)

## 2021-07-15 LAB — SARS CORONAVIRUS 2 (TAT 6-24 HRS): SARS Coronavirus 2: NEGATIVE

## 2021-07-15 MED ORDER — AMLODIPINE BESYLATE 10 MG PO TABS: 10.0000 mg | ORAL_TABLET | Freq: Every day | ORAL | Status: DC

## 2021-07-15 MED ORDER — AMLODIPINE BESYLATE 10 MG PO TABS: 10.0000 mg | ORAL_TABLET | Freq: Every day | ORAL | 0 refills | Status: DC

## 2021-07-15 NOTE — Discharge Summary (Signed)
Physician Discharge Summary  [name] URK:[id number] DOB: June 22, 1971 DOA: 07/11/2021  PCP: [name], MD  Admit date: 07/11/2021 Discharge date: 07/15/2021  Admitted From: Home Disposition: Skilled nursing facility  Recommendations for Outpatient Follow-up:  Follow up with PCP within 1-2 weeks to monitor CK and liver panel. Monitor blood pressures as she was started on amlodipine 10mg .  Follow up with neurology in 1-2 weeks to further evaluate for myositis  Equipment/Devices:BSC/3in1 tub/shower seat, wheelchair  Discharge Condition:stable, improved CODE STATUS:  Code Status: Full Code  Regular healthy diet  Brief/Interim Summary: Pt presented with progressive lower extremity weakness that prevented ambulation. She was evaluated for stroke which was negative and no spine pathology on imaging either. Found to have elevated CK and liver transaminases. Neurology was consulted to evaluate for myositis vs non-traumatic rhabdomyolysis. Her statin was held and she had supplemental IV fluids and steroids. She had rapid improvement in her CK/liver enzymes progressively but did not endorse any improvement in her strength. She continued to work with PT/OT throughout stay.  She will need to keep off statins in the future as this was thought to be primary cause of her presentation. She will need follow up with neurology to further evaluate her weakness if not resolved with rehab.  Steroids were not continued but may have a myositis component to presentation. Additionally, patient denied history of hypertension but was consistently hypertensive throughout stay. She was started on amlodipine 5mg  and increased to 10mg  on day of discharge. Patient was asymptoamtic,   Discharge Diagnoses:  Principal Problem:   Rhabdomyolysis Active Problems:   PTSD (post-traumatic stress disorder)   Polymyositis ([place])   Transaminitis  Allergies as of 07/15/2021       Reactions   Atorvastatin Other (See  Comments)   Rhabdomyolysis [facility] admission 07/2021   Fluoxetine Hives   Aspirin    Pregabalin Other (See Comments)   Ibuprofen Rash, Other (See Comments)   THROAT SPASMS   Lamotrigine Rash   Penicillins Rash   Did it involve swelling of the face/tongue/throat, SOB, or low BP? No Did it involve sudden or severe rash/hives, skin peeling, or any reaction on the inside of your mouth or nose? Yes Did you need to seek medical attention at a hospital or doctor's office? Unknown When did it last happen?childhood reaction     If all above answers are "NO", may proceed with cephalosporin use.   Shrimp [shellfish Allergy] Swelling, Rash        Medication List     STOP taking these medications    morphine 15 MG 12 hr tablet Commonly known as: MS CONTIN   pantoprazole 40 MG tablet Commonly known as: Protonix   Vitamin D (Ergocalciferol) 1.25 MG (50000 UNIT) Caps capsule Commonly known as: DRISDOL       TAKE these medications    albuterol 108 (90 Base) MCG/ACT inhaler Commonly known as: VENTOLIN HFA Inhale 2 puffs into the lungs 2 (two) times daily.   amLODipine 10 MG tablet Commonly known as: NORVASC Take 1 tablet (10 mg total) by mouth daily.   budesonide-formoterol 160-4.5 MCG/ACT inhaler Commonly known as: SYMBICORT Inhale 2 puffs into the lungs 2 (two) times daily.   cetirizine 10 MG tablet Commonly known as: ZYRTEC Take 10 mg by mouth at bedtime.   clonazePAM 1 MG tablet Commonly known as: KLONOPIN Take 1 mg by mouth at bedtime.   clotrimazole-betamethasone cream Commonly known as: LOTRISONE Apply 1 application topically 2 (two) times daily as needed (  skin irritation/rash).   cyclobenzaprine 10 MG tablet Commonly known as: FLEXERIL TAKE 1 TABLET BY MOUTH TWICE A DAY AS NEEDED FOR MUSCLE SPASM What changed: See the new instructions.   famotidine 20 MG tablet Commonly known as: PEPCID Take 20 mg by mouth 2 (two) times daily as needed for heartburn or  indigestion.   gabapentin 100 MG capsule Commonly known as: NEURONTIN Take 300 mg by mouth 2 (two) times daily.   linaclotide 145 MCG Caps capsule Commonly known as: LINZESS Take 145 mcg by mouth daily as needed (opioid induced constipation).   montelukast 10 MG tablet Commonly known as: SINGULAIR Take 10 mg by mouth at bedtime.   promethazine 25 MG tablet Commonly known as: PHENERGAN Take 25 mg by mouth 3 (three) times daily as needed for nausea or vomiting.   QUEtiapine 25 MG tablet Commonly known as: SEROQUEL Take 50-100 mg by mouth 3 (three) times daily as needed (severe anxiety).   sertraline 100 MG tablet Commonly known as: ZOLOFT Take 300 mg by mouth daily.        Contact information for after-discharge care     [facility] Preferred SNF .   Service: Skilled Nursing Contact information: [address] [place] [phone]                    Allergies  Allergen Reactions   Atorvastatin Other (See Comments)    Rhabdomyolysis [place] admission 07/2021   Fluoxetine Hives   Aspirin    Pregabalin Other (See Comments)   Ibuprofen Rash and Other (See Comments)    THROAT SPASMS   Lamotrigine Rash   Penicillins Rash    Did it involve swelling of the face/tongue/throat, SOB, or low BP? No Did it involve sudden or severe rash/hives, skin peeling, or any reaction on the inside of your mouth or nose? Yes Did you need to seek medical attention at a hospital or doctor's office? Unknown When did it last happen?childhood reaction     If all above answers are "NO", may proceed with cephalosporin use.    Shrimp [Shellfish Allergy] Swelling and Rash    Consultations: Neurology   Procedures/Studies: MR Cervical Spine W or Wo Contrast  Result Date: 07/11/2021 CLINICAL DATA:  Acute cervical myelopathy. History of squamous cell carcinoma,  basal cell carcinoma, breast cancer. EXAM: MRI CERVICAL SPINE WITHOUT AND WITH CONTRAST TECHNIQUE: Multiplanar and multiecho pulse sequences of the cervical spine, to include the craniocervical junction and cervicothoracic junction, were obtained without and with intravenous contrast. CONTRAST:  3mL GADAVIST GADOBUTROL 1 MMOL/ML IV SOLN COMPARISON:  None. FINDINGS: Alignment: Normal alignment with straightening of the cervical lordosis Vertebrae: Negative for fracture or mass Cord: Normal signal and morphology.  No enhancing cord lesion. Posterior Fossa, vertebral arteries, paraspinal tissues: Negative Disc levels: C2-3: Negative for stenosis C3-4: Mild left foraminal narrowing due to uncinate spurring. Spinal canal normal in size C4-5: Moderate left foraminal narrowing due to asymmetric uncinate spurring on the left. Mild cord flattening and mild spinal stenosis due to spurring C5-6: Moderate left foraminal narrowing due to asymmetric spurring. Mild cord flattening and mild spinal stenosis C6-7: Mild left foraminal narrowing due to spurring C7-T1: Negative IMPRESSION: Left foraminal narrowing C3-4, C4-5, C5-6, C6-7 due to asymmetric spurring. Mild spinal stenosis at C4-5 and C5-6. Electronically Signed   By: Franchot Gallo M.D.   On: 07/11/2021 13:02   US  Abdomen Limited RUQ (LIVER/GB)  Result Date: 07/11/2021 CLINICAL DATA:  Transaminitis EXAM: ULTRASOUND ABDOMEN LIMITED RIGHT UPPER QUADRANT COMPARISON:  10/28/2016 FINDINGS: Gallbladder: No gallstones or wall thickening visualized. No sonographic Murphy sign noted by sonographer. Common bile duct: Diameter: 5 mm Liver: Heterogeneous increased liver echotexture compatible with hepatic steatosis. There is scattered areas of focal fatty sparing. No other focal liver abnormalities or intrahepatic biliary duct dilation. Portal vein is patent on color Doppler imaging with normal direction of blood flow towards the liver. Other: None. IMPRESSION: 1. Stable hepatic  steatosis. 2. Otherwise unremarkable exam. Electronically Signed   By: Randa Ngo M.D.   On: 07/11/2021 17:46    Subjective: Patient denies any pain. She states that she has had no improvement in her weakness since admission. She is concerned about her elevated blood pressures.   Discharge Exam: Vitals:   07/15/21 0518 07/15/21 0751  BP: (!) 155/102 (!) 160/99  Pulse: (!) 101 98  Resp: 18 18  Temp: 98.4 F (36.9 C) 98.2 F (36.8 C)  SpO2: 94% 94%    General: Pt is alert, awake, not in acute distress Cardiovascular: RRR, S1/S2 +, no rubs, no gallops Respiratory: CTA bilaterally, no wheezing, no rhonchi Abdominal: Soft, NT, ND, bowel sounds + Extremities: no edema, no cyanosis. Moving all equally. Generally normal strength, mildly reduced diffusely  Labs: Basic Metabolic Panel: Recent Labs  Lab 07/11/21 1022 07/12/21 0426 07/15/21 0554  NA 136 139 140  K 4.0 3.5 3.8  CL 98 102 101  CO2 26 22 32  GLUCOSE 88 66* 100*  BUN 10 6 9   CREATININE 0.43* 0.45 <0.30*  CALCIUM 8.9 8.8* 9.0   CBC: Recent Labs  Lab 07/11/21 1022 07/12/21 0426  WBC 8.4 7.2  HGB 11.9* 11.2*  HCT 35.2* 34.2*  MCV 87.6 88.1  PLT 290 285    Microbiology Recent Results (from the past 240 hour(s))  Resp Panel by RT-PCR (Flu A&B, Covid) Nasopharyngeal Swab     Status: None   Collection Time: 07/11/21  6:19 PM   Specimen: Nasopharyngeal Swab; Nasopharyngeal(NP) swabs in vial transport medium  Result Value Ref Range Status   SARS Coronavirus 2 by RT PCR NEGATIVE NEGATIVE Final    Comment: (NOTE) SARS-CoV-2 target nucleic acids are NOT DETECTED.  The SARS-CoV-2 RNA is generally detectable in upper respiratory specimens during the acute phase of infection. The lowest concentration of SARS-CoV-2 viral copies this assay can detect is 138 copies/mL. A negative result does not preclude SARS-Cov-2 infection and should not be used as the sole basis for treatment or other patient management  decisions. A negative result may occur with  improper specimen collection/handling, submission of specimen other than nasopharyngeal swab, presence of viral mutation(s) within the areas targeted by this assay, and inadequate number of viral copies(<138 copies/mL). A negative result must be combined with clinical observations, patient history, and epidemiological information. The expected result is Negative.  Fact Sheet for Patients:  EntrepreneurPulse.com.au  Fact Sheet for Healthcare Providers:  IncredibleEmployment.be  This test is no t yet approved or cleared by the [place] FDA and  has been authorized for detection and/or diagnosis of SARS-CoV-2 by FDA under an Emergency Use Authorization (EUA). This EUA will remain  in effect (meaning this test can be used) for the duration of the COVID-19 declaration under Section 564(b)(1) of the Act, 21 U.S.C.section 360bbb-3(b)(1), unless the authorization is terminated  or revoked sooner.       Influenza A by PCR NEGATIVE NEGATIVE  Final   Influenza B by PCR NEGATIVE NEGATIVE Final    Comment: (NOTE) The Xpert Xpress SARS-CoV-2/FLU/RSV plus assay is intended as an aid in the diagnosis of influenza from Nasopharyngeal swab specimens and should not be used as a sole basis for treatment. Nasal washings and aspirates are unacceptable for Xpert Xpress SARS-CoV-2/FLU/RSV testing.  Fact Sheet for Patients: EntrepreneurPulse.com.au  Fact Sheet for Healthcare Providers: IncredibleEmployment.be  This test is not yet approved or cleared by the [place] FDA and has been authorized for detection and/or diagnosis of SARS-CoV-2 by FDA under an Emergency Use Authorization (EUA). This EUA will remain in effect (meaning this test can be used) for the duration of the COVID-19 declaration under Section 564(b)(1) of the Act, 21 U.S.C. section 360bbb-3(b)(1), unless the  authorization is terminated or revoked.  Performed at [facility], [place], [place] [address]   SARS CORONAVIRUS 2 (TAT 6-24 HRS) Nasopharyngeal Nasopharyngeal Swab     Status: None   Collection Time: 07/14/21  4:18 PM   Specimen: Nasopharyngeal Swab  Result Value Ref Range Status   SARS Coronavirus 2 NEGATIVE NEGATIVE Final    Comment: (NOTE) SARS-CoV-2 target nucleic acids are NOT DETECTED.  The SARS-CoV-2 RNA is generally detectable in upper and lower respiratory specimens during the acute phase of infection. Negative results do not preclude SARS-CoV-2 infection, do not rule out co-infections with other pathogens, and should not be used as the sole basis for treatment or other patient management decisions. Negative results must be combined with clinical observations, patient history, and epidemiological information. The expected result is Negative.  Fact Sheet for Patients: SugarRoll.be  Fact Sheet for Healthcare Providers: https://www.woods-mathews.com/  This test is not yet approved or cleared by the [place] FDA and  has been authorized for detection and/or diagnosis of SARS-CoV-2 by FDA under an Emergency Use Authorization (EUA). This EUA will remain  in effect (meaning this test can be used) for the duration of the COVID-19 declaration under Se ction 564(b)(1) of the Act, 21 U.S.C. section 360bbb-3(b)(1), unless the authorization is terminated or revoked sooner.  Performed at [facility], [place] [address], [place], [place] [address]     Time coordinating discharge: Over 30 minutes  Richarda Osmond, MD  [facility] 07/15/2021, 10:29 AM

## 2021-07-15 NOTE — TOC Progression Note (Addendum)
Transition of Care [facility]) - Progression Note    Patient Details  Name: [name] MRN: [id number] Date of Birth: Feb 22, 1971  [facility] CM/SW [place], RN Phone Number: 07/15/2021, 11:35 AM  Clinical Narrative:   Patient discharged to [facility] today , room 401 as per Magda Paganini at facility.   Patient will be transported by EMS today.    Addendum 12:02 as per EMS, patient is first on the list   Expected Discharge Plan: [place] Barriers to Discharge: Continued Medical Work up  Expected Discharge Plan and Services Expected Discharge Plan: [place] arrangements for the past 2 months: Single Family Home Expected Discharge Date: 07/15/21                                     Social Determinants of Health (SDOH) Interventions    Readmission Risk Interventions Readmission Risk Prevention Plan 07/13/2021  Transportation Screening Complete  PCP or Specialist Appt within 3-5 Days Complete  HRI or [place] Complete  Social Work Consult for [place] Planning/Counseling Complete  Palliative Care Screening Not Applicable  Medication Review Press photographer) Complete  Some recent data might be hidden

## 2021-07-15 NOTE — Progress Notes (Signed)
Report called to Lorelle Gibbs, RN at [facility].

## 2021-07-22 ENCOUNTER — Ambulatory Visit
Admission: RE | Admit: 2021-07-22 | Discharge: 2021-07-22 | Disposition: A | Discharge status: Home / Self Care | Payer: Medicare Other | Source: Ambulatory Visit | Attending: Nurse Practitioner | Admitting: Nurse Practitioner

## 2021-07-22 ENCOUNTER — Other Ambulatory Visit: Payer: Self-pay

## 2021-07-22 DIAGNOSIS — I739 Peripheral vascular disease, unspecified: Secondary | ICD-10-CM | POA: Diagnosis not present

## 2021-07-22 HISTORY — DX: Unspecified asthma, uncomplicated: J45.909

## 2021-07-22 HISTORY — DX: Essential (primary) hypertension: I10

## 2021-07-22 MED ORDER — IOHEXOL 350 MG/ML SOLN
125.0000 mL | Freq: Once | INTRAVENOUS | Status: AC | PRN
  Administered 2021-07-22: 12:00:00 125 mL via INTRAVENOUS

## 2021-07-29 ENCOUNTER — Ambulatory Visit (INDEPENDENT_AMBULATORY_CARE_PROVIDER_SITE_OTHER): Payer: Medicare Other | Admitting: Vascular Surgery

## 2021-07-29 ENCOUNTER — Other Ambulatory Visit: Payer: Self-pay

## 2021-07-29 VITALS — BP 118/77 | HR 109 | Ht 63.0 in | Wt 109.0 lb

## 2021-07-29 DIAGNOSIS — M797 Fibromyalgia: Secondary | ICD-10-CM | POA: Diagnosis not present

## 2021-07-29 DIAGNOSIS — M79609 Pain in unspecified limb: Secondary | ICD-10-CM | POA: Insufficient documentation

## 2021-07-29 DIAGNOSIS — M79605 Pain in left leg: Secondary | ICD-10-CM

## 2021-07-29 DIAGNOSIS — M79604 Pain in right leg: Secondary | ICD-10-CM

## 2021-07-29 NOTE — Progress Notes (Signed)
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   Calcium 9.0 8.9 - 10.3 mg/dL   Total Protein 6.6 6.5 - 8.1 g/dL   Albumin 3.1 (L) 3.5 - 5.0 g/dL   AST 215 (H) 15 - 41 U/L   ALT 215 (H) 0 - 44 U/L   Alkaline Phosphatase 53 38 - 126 U/L   Total Bilirubin 0.5 0.3 - 1.2 mg/dL   GFR, Estimated NOT CALCULATED >60 mL/min    Comment: (NOTE) Calculated using the CKD-EPI  Creatinine Equation (2021)    Anion gap 7 5 - 15    Comment: Performed at [facility], [address] [place], [place] [address]    Radiology MR Cervical Spine W or Wo Contrast  Result Date: 07/11/2021 CLINICAL DATA:  Acute cervical myelopathy. History of squamous cell carcinoma, basal cell carcinoma, breast cancer. EXAM: MRI CERVICAL SPINE WITHOUT AND WITH CONTRAST TECHNIQUE: Multiplanar and multiecho pulse sequences of the cervical spine, to include the craniocervical junction and cervicothoracic junction, were obtained without and with intravenous contrast. CONTRAST:  80m GADAVIST GADOBUTROL 1 MMOL/ML IV SOLN COMPARISON:  None. FINDINGS: Alignment: Normal alignment with straightening of the cervical lordosis Vertebrae: Negative for fracture or mass Cord: Normal signal and morphology.  No enhancing cord lesion. Posterior Fossa, vertebral arteries, paraspinal tissues: Negative Disc levels: C2-3: Negative for stenosis C3-4: Mild left foraminal narrowing due to uncinate spurring. Spinal canal normal in size C4-5: Moderate left foraminal narrowing due to asymmetric uncinate spurring on the left. Mild cord flattening and mild spinal stenosis due to spurring C5-6: Moderate left foraminal narrowing due to asymmetric spurring. Mild cord flattening and mild spinal stenosis C6-7: Mild left foraminal narrowing due to spurring C7-T1: Negative IMPRESSION: Left foraminal narrowing C3-4, C4-5, C5-6, C6-7 due to asymmetric spurring. Mild spinal stenosis at C4-5 and C5-6. Electronically Signed   By: CFranchot GalloM.D.   On: 07/11/2021 13:02   CT ANGIO AO+BIFEM W & OR WO CONTRAST  Result Date: 07/22/2021 CLINICAL DATA:  50year old female with inability to walk, history of rhabdomyolysis. EXAM: CT ANGIOGRAPHY OF ABDOMINAL AORTA WITH ILIOFEMORAL RUNOFF TECHNIQUE: Multidetector CT imaging of the abdomen, pelvis and lower extremities was performed using the standard protocol during bolus administration of  intravenous contrast. Multiplanar CT image reconstructions and MIPs were obtained to evaluate the vascular anatomy. CONTRAST:  1248mOMNIPAQUE IOHEXOL 350 MG/ML SOLN COMPARISON:  None. FINDINGS: VASCULAR Aorta: Normal caliber aorta without aneurysm, dissection, vasculitis or significant stenosis. Celiac: Patent without evidence of aneurysm, dissection, vasculitis or significant stenosis. SMA: Patent without evidence of aneurysm, dissection, vasculitis or significant stenosis. Renals: Single bilateral renal arteries are patent without evidence of aneurysm, dissection, vasculitis, fibromuscular dysplasia or significant stenosis. IMA: Patent without evidence of aneurysm, dissection, vasculitis or significant stenosis. RIGHT Lower Extremity Inflow: Common, internal and external iliac arteries are patent without evidence of aneurysm, dissection, vasculitis or significant stenosis. Outflow: Common, superficial and profunda femoral arteries and the popliteal artery are patent without evidence of aneurysm, dissection, vasculitis or significant stenosis. Runoff: Patent three vessel runoff  to the ankle. LEFT Lower Extremity Inflow: Common, internal and external iliac arteries are patent without evidence of aneurysm, dissection, vasculitis or significant stenosis. Outflow: Common, superficial and profunda femoral arteries and the popliteal artery are patent without evidence of aneurysm, dissection, vasculitis or significant stenosis. Runoff: Patent three vessel runoff to the ankle. Veins: No obvious venous abnormality within the limitations of this arterial phase study. Review of the MIP images confirms the above findings. NON-VASCULAR Lower chest: Lingular subsegmental atelectasis. Bilateral breast prostheses are noted. The heart is normal in size. No pericardial effusion. Hepatobiliary: No focal liver abnormality is seen. No gallstones, gallbladder wall thickening, or biliary dilatation. Pancreas: Unremarkable. No pancreatic  ductal dilatation or surrounding inflammatory changes. Spleen: Normal in size without focal abnormality. Adrenals/Urinary Tract: Adrenal glands are unremarkable. Kidneys are normal, without renal calculi, focal lesion, or hydronephrosis. Bladder is unremarkable. Stomach/Bowel: Small hiatal hernia. Stomach is otherwise within normal limits. Appendix appears normal. No evidence of bowel wall thickening, distention, or inflammatory changes. Lymphatic: No abdominopelvic lymphadenopathy. Reproductive: Status post hysterectomy. No adnexal masses. Other: No abdominal wall hernia or abnormality. No abdominopelvic ascites. Musculoskeletal: No acute or significant osseous findings. IMPRESSION: VASCULAR No acute vascular abnormality or evidence of significant peripheral artery disease. Patent bilateral lower extremity runoff. NON-VASCULAR 1. No acute abdominopelvic abnormality. 2. Small hiatal hernia. Ruthann Cancer, MD [facility] Radiology Electronically Signed   By: Ruthann Cancer M.D.   On: 07/22/2021 13:43   US Abdomen Limited RUQ (LIVER/GB)  Result Date: 07/11/2021 CLINICAL DATA:  Transaminitis EXAM: ULTRASOUND ABDOMEN LIMITED RIGHT UPPER QUADRANT COMPARISON:  10/28/2016 FINDINGS: Gallbladder: No gallstones or wall thickening visualized. No sonographic Murphy sign noted by sonographer. Common bile duct: Diameter: 5 mm Liver: Heterogeneous increased liver echotexture compatible with hepatic steatosis. There is scattered areas of focal fatty sparing. No other focal liver abnormalities or intrahepatic biliary duct dilation. Portal vein is patent on color Doppler imaging with normal direction of blood flow towards the liver. Other: None. IMPRESSION: 1. Stable hepatic steatosis. 2. Otherwise unremarkable exam. Electronically Signed   By: Randa Ngo M.D.   On: 07/11/2021 17:46    Assessment/Plan  Fibromyalgia Likely a significant component of her lower extremity  symptoms.  Has an extensive neurologic work-up coming after the first of the year.  Pain in limb I have independently reviewed her CT angiogram of the abdomen and pelvis with runoff.  The patient has no significant PAD appreciable.  Her arteries are quite clean and there does not appear to be any arterial insufficiency of either lower extremity.  It does not appear as if arterial insufficiency is the cause of her lower extremity symptoms.  She has an extensive neurologic work-up planned after the first of the year.  I will see her back as needed at this point.    Leotis Pain, MD  07/29/2021 9:17 AM    This note was created with Dragon medical transcription system.  Any errors from dictation are purely unintentional

## 2021-07-29 NOTE — Assessment & Plan Note (Signed)
Likely a significant component of her lower extremity symptoms.  Has an extensive neurologic work-up coming after the first of the year.

## 2021-07-29 NOTE — Assessment & Plan Note (Signed)
I have independently reviewed her CT angiogram of the abdomen and pelvis with runoff.  The patient has no significant PAD appreciable.  Her arteries are quite clean and there does not appear to be any arterial insufficiency of either lower extremity.  It does not appear as if arterial insufficiency is the cause of her lower extremity symptoms.  She has an extensive neurologic work-up planned after the first of the year.  I will see her back as needed at this point.
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
## 2021-12-09 ENCOUNTER — Encounter: Payer: Self-pay | Admitting: Urology

## 2021-12-09 ENCOUNTER — Ambulatory Visit (INDEPENDENT_AMBULATORY_CARE_PROVIDER_SITE_OTHER): Payer: Medicare Other | Admitting: Urology

## 2021-12-09 ENCOUNTER — Other Ambulatory Visit: Payer: Self-pay | Admitting: Urology

## 2021-12-09 VITALS — BP 123/84 | HR 103 | Ht 63.0 in

## 2021-12-09 DIAGNOSIS — R32 Unspecified urinary incontinence: Secondary | ICD-10-CM

## 2021-12-09 DIAGNOSIS — N3010 Interstitial cystitis (chronic) without hematuria: Secondary | ICD-10-CM

## 2021-12-09 LAB — MICROSCOPIC EXAMINATION

## 2021-12-09 LAB — URINALYSIS, COMPLETE
Bilirubin, UA: NEGATIVE
Glucose, UA: NEGATIVE
Ketones, UA: NEGATIVE
Leukocytes,UA: NEGATIVE
Nitrite, UA: NEGATIVE
Protein,UA: NEGATIVE
RBC, UA: NEGATIVE
Specific Gravity, UA: >1.03 — ABNORMAL HIGH (ref 1.005–1.030)
Urobilinogen, Ur: 0.2 mg/dL (ref 0.2–1.0)
pH, UA: 5.5 (ref 5.0–7.5)

## 2021-12-09 LAB — BLADDER SCAN AMB NON-IMAGING: Scan Result: 1

## 2021-12-09 NOTE — Progress Notes (Signed)
Surgical Physician Order Form [facility] ? ?* Scheduling expectation : Next Available ? ?*Length of Case:  ? ?*Clearance needed: no ? ?*Anticoagulation Instructions: N/A ? ?*Aspirin Instructions: Ok to continue Aspirin ? ?*Post-op visit Date/Instructions:   as needed for recurrent symptoms ? ?*Diagnosis:  Interstitial cystitis ? ?*Procedure: Cystoscopy/hydrodistention ? ? ?Additional orders: N/A ? ?-Admit type: OUTpatient ? ?-Anesthesia: General ? ?-VTE Prophylaxis Standing Order SCD?s    ?   ?Other:  ? ?-Standing Lab Orders Per Anesthesia   ? ?Lab other: None ? ?-Standing Test orders EKG/Chest x-ray per Anesthesia      ? ?Test other:  ? ?- Medications:  Ancef 2gm IV ? ?-Other orders:  Ok to proceed with Ancef PCN allergy reviewed ? ? ? ?  ? ?

## 2021-12-12 ENCOUNTER — Other Ambulatory Visit: Payer: Self-pay

## 2021-12-12 LAB — CULTURE, URINE COMPREHENSIVE

## 2021-12-15 ENCOUNTER — Telehealth: Payer: Self-pay

## 2021-12-15 NOTE — Telephone Encounter (Signed)
I have called the patient to schedule surgery on 12/09/2021, 12/12/2021 and 12/15/2021 without answer, no voicemail set up. Will try again. I will send a mychart message as well.  ?

## 2021-12-16 NOTE — Telephone Encounter (Signed)
I spoke with Mrs. Ogren. We have discussed possible surgery dates and Monday July 10th, 2023 was agreed upon by all parties. Patient given information about surgery date, what to expect pre-operatively and post operatively.  ?We discussed that a Pre-Admission Testing office will be calling to set up the pre-op visit that will take place prior to surgery, and that these appointments are typically done over the phone with a Pre-Admissions RN.  ?Informed patient that our office will communicate any additional care to be provided after surgery. Patients questions or concerns were discussed during our call. Advised to call our office should there be any additional information, questions or concerns that arise. Patient verbalized understanding.  ? ?

## 2021-12-16 NOTE — Progress Notes (Signed)
[facility] Posting Form  ? ?Surgery Date/Time: Date: 02/09/2022 ? ?Surgeon: Dr. [name], MD ? ?Surgery Location: Day Surgery ? ?Inpt ( No  )   Outpt (Yes)   Obs ( No  )  ? ?Diagnosis: Interstitial Cystitis N30.10 ? ?-CPT: 52260 ? ?Surgery: Cystoscopy Hydrodistention ? ?Stop Anticoagulations: N/A, may continue ASA ? ?Cardiac/Medical/Pulmonary Clearance needed: no ? ?*Orders entered into EPIC  Date: 12/16/21  ? ?*Case booked in [place]  Date: 12/16/21 ? ?*Notified pt of Surgery: Date: 12/16/21 ? ?PRE-OP UA & CX: no ? ?*Placed into Prior Authorization Work Que Date: 12/16/21 ? ? ?Assistant/laser/rep:No ? ? ? ? ? ? ? ? ? ? ? ? ? ? ? ?

## 2021-12-28 ENCOUNTER — Encounter: Payer: Self-pay | Admitting: Urology

## 2021-12-31 ENCOUNTER — Telehealth: Payer: Self-pay

## 2021-12-31 ENCOUNTER — Other Ambulatory Visit: Payer: Self-pay

## 2021-12-31 DIAGNOSIS — N3010 Interstitial cystitis (chronic) without hematuria: Secondary | ICD-10-CM

## 2021-12-31 NOTE — Telephone Encounter (Signed)
Spoke with pt. Pt. Advised. Scheduled for 01/26/2022.

## 2021-12-31 NOTE — Telephone Encounter (Signed)
-----   Message from [name], MD sent at 12/31/2021 10:47 AM EDT ----- Since her surgery isn't until July, please have her repeat her UA/ UCx about 2 weeks preop.    [name], MD

## 2022-01-07 ENCOUNTER — Telehealth: Payer: Self-pay

## 2022-01-07 NOTE — Telephone Encounter (Signed)
[facility] for Annual wellness. Paper records. Voicemail for is full unable to leave message.

## 2022-01-08 NOTE — Telephone Encounter (Signed)
Voicemail is full unable to leave message

## 2022-01-08 NOTE — Telephone Encounter (Signed)
Patient is scheduled for 03/10/22 with [facility]

## 2022-01-26 ENCOUNTER — Telehealth: Payer: Self-pay | Admitting: Urology

## 2022-01-26 ENCOUNTER — Other Ambulatory Visit: Payer: Medicare Other

## 2022-01-30 ENCOUNTER — Inpatient Hospital Stay
Admission: RE | Admit: 2022-01-30 | Discharge: 2022-01-30 | Disposition: A | Discharge status: Home / Self Care | Payer: Medicare Other | Source: Ambulatory Visit

## 2022-01-30 HISTORY — DX: Mitochondrial myopathy, not elsewhere classified: G71.3

## 2022-01-30 HISTORY — DX: Unstable angina: I20.0

## 2022-01-30 HISTORY — DX: Polymyositis, organ involvement unspecified: M33.20

## 2022-01-30 HISTORY — DX: Hyperlipidemia, unspecified: E78.5

## 2022-01-30 HISTORY — DX: Rhabdomyolysis: M62.82

## 2022-02-02 NOTE — Telephone Encounter (Signed)
Spoke with pt. Pt. Informed me that she would like to cancel surgery and I have removed her from the OR grid.

## 2022-02-09 ENCOUNTER — Encounter: Admission: RE | Payer: Self-pay | Source: Home / Self Care

## 2022-02-09 ENCOUNTER — Ambulatory Visit: Admission: RE | Admit: 2022-02-09 | Payer: Medicare Other | Source: Home / Self Care | Admitting: Urology

## 2022-02-09 SURGERY — CYSTOSCOPY, WITH BLADDER HYDRODISTENSION
Anesthesia: General

## 2022-03-10 ENCOUNTER — Ambulatory Visit: Payer: Medicare Other | Admitting: Obstetrics and Gynecology

## 2022-06-01 ENCOUNTER — Encounter (INDEPENDENT_AMBULATORY_CARE_PROVIDER_SITE_OTHER): Payer: Self-pay

## 2022-11-09 ENCOUNTER — Other Ambulatory Visit: Payer: Self-pay | Admitting: Internal Medicine

## 2022-11-12 ENCOUNTER — Other Ambulatory Visit: Payer: Self-pay | Admitting: Internal Medicine

## 2022-11-13 ENCOUNTER — Other Ambulatory Visit: Payer: Self-pay

## 2022-11-13 MED ORDER — GABAPENTIN 600 MG PO TABS: 600.0000 mg | ORAL_TABLET | Freq: Two times a day (BID) | ORAL | 3 refills | Status: DC

## 2022-11-14 ENCOUNTER — Other Ambulatory Visit: Payer: Self-pay | Admitting: Family

## 2022-11-14 DIAGNOSIS — Z171 Estrogen receptor negative status [ER-]: Secondary | ICD-10-CM

## 2022-11-14 DIAGNOSIS — D0511 Intraductal carcinoma in situ of right breast: Secondary | ICD-10-CM

## 2022-12-07 ENCOUNTER — Encounter: Payer: Self-pay | Admitting: Obstetrics and Gynecology

## 2022-12-07 ENCOUNTER — Ambulatory Visit: Payer: Medicare Other | Admitting: Internal Medicine

## 2022-12-08 ENCOUNTER — Ambulatory Visit: Payer: Medicare Other | Admitting: Internal Medicine

## 2022-12-08 ENCOUNTER — Encounter: Payer: Self-pay | Admitting: Internal Medicine

## 2022-12-08 VITALS — BP 122/62 | HR 107 | Ht 63.0 in | Wt 198.0 lb

## 2022-12-08 DIAGNOSIS — G729 Myopathy, unspecified: Secondary | ICD-10-CM

## 2022-12-08 DIAGNOSIS — I10 Essential (primary) hypertension: Secondary | ICD-10-CM | POA: Diagnosis not present

## 2022-12-08 DIAGNOSIS — M797 Fibromyalgia: Secondary | ICD-10-CM

## 2022-12-08 DIAGNOSIS — R531 Weakness: Secondary | ICD-10-CM | POA: Diagnosis not present

## 2022-12-08 DIAGNOSIS — E782 Mixed hyperlipidemia: Secondary | ICD-10-CM | POA: Diagnosis not present
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
## 2022-12-09 ENCOUNTER — Encounter: Payer: Self-pay | Admitting: Internal Medicine

## 2022-12-09 LAB — CBC WITH DIFFERENTIAL
Basophils Absolute: 0.1 10*3/uL (ref 0.0–0.2)
Basos: 1 %
EOS (ABSOLUTE): 0.2 10*3/uL (ref 0.0–0.4)
Eos: 3 %
Hematocrit: 38.6 % (ref 34.0–46.6)
Hemoglobin: 13.1 g/dL (ref 11.1–15.9)
Immature Grans (Abs): 0.1 10*3/uL (ref 0.0–0.1)
Immature Granulocytes: 1 %
Lymphocytes Absolute: 1.8 10*3/uL (ref 0.7–3.1)
Lymphs: 23 %
MCH: 30.7 pg (ref 26.6–33.0)
MCHC: 33.9 g/dL (ref 31.5–35.7)
MCV: 90 fL (ref 79–97)
Monocytes Absolute: 0.6 10*3/uL (ref 0.1–0.9)
Monocytes: 7 %
Neutrophils Absolute: 5.2 10*3/uL (ref 1.4–7.0)
Neutrophils: 65 %
RBC: 4.27 x10E6/uL (ref 3.77–5.28)
RDW: 14.9 % (ref 11.7–15.4)
WBC: 8 10*3/uL (ref 3.4–10.8)

## 2022-12-09 LAB — LIPID PANEL W/O CHOL/HDL RATIO
Cholesterol, Total: 304 mg/dL — ABNORMAL HIGH (ref 100–199)
HDL: 64 mg/dL (ref >39)
LDL Chol Calc (NIH): 205 mg/dL — ABNORMAL HIGH (ref 0–99)
Triglycerides: 185 mg/dL — ABNORMAL HIGH (ref 0–149)
VLDL Cholesterol Cal: 35 mg/dL (ref 5–40)

## 2022-12-09 LAB — CMP14+EGFR
ALT: 38 IU/L — ABNORMAL HIGH (ref 0–32)
AST: 64 IU/L — ABNORMAL HIGH (ref 0–40)
Albumin/Globulin Ratio: 1.8 (ref 1.2–2.2)
Albumin: 4.5 g/dL (ref 3.8–4.9)
Alkaline Phosphatase: 83 IU/L (ref 44–121)
BUN/Creatinine Ratio: 19 (ref 9–23)
BUN: 11 mg/dL (ref 6–24)
Bilirubin Total: 0.4 mg/dL (ref 0.0–1.2)
CO2: 22 mmol/L (ref 20–29)
Calcium: 9.7 mg/dL (ref 8.7–10.2)
Chloride: 97 mmol/L (ref 96–106)
Creatinine, Ser: 0.59 mg/dL (ref 0.57–1.00)
Globulin, Total: 2.5 g/dL (ref 1.5–4.5)
Glucose: 92 mg/dL (ref 70–99)
Potassium: 4.7 mmol/L (ref 3.5–5.2)
Sodium: 139 mmol/L (ref 134–144)
Total Protein: 7 g/dL (ref 6.0–8.5)
eGFR: 108 mL/min/{1.73_m2} (ref >59)

## 2022-12-09 LAB — CK: Total CK: 551 U/L (ref 32–182)

## 2022-12-10 ENCOUNTER — Other Ambulatory Visit: Payer: Self-pay | Admitting: Internal Medicine

## 2022-12-10 DIAGNOSIS — E7801 Familial hypercholesterolemia: Secondary | ICD-10-CM

## 2022-12-10 MED ORDER — NEXLIZET 180-10 MG PO TABS
1.0000 | ORAL_TABLET | Freq: Every day | ORAL | 6 refills | Status: DC
Start: 2022-12-10 — End: 2023-09-20

## 2022-12-11 ENCOUNTER — Other Ambulatory Visit: Payer: Self-pay | Admitting: Internal Medicine

## 2022-12-11 DIAGNOSIS — E7801 Familial hypercholesterolemia: Secondary | ICD-10-CM

## 2022-12-24 ENCOUNTER — Other Ambulatory Visit: Payer: Self-pay | Admitting: Family

## 2023-01-08 ENCOUNTER — Other Ambulatory Visit: Payer: Self-pay | Admitting: Internal Medicine

## 2023-01-08 DIAGNOSIS — E7801 Familial hypercholesterolemia: Secondary | ICD-10-CM

## 2023-01-15 ENCOUNTER — Encounter: Payer: Self-pay | Admitting: Internal Medicine

## 2023-01-19 ENCOUNTER — Other Ambulatory Visit: Payer: Self-pay | Admitting: Internal Medicine

## 2023-01-19 MED ORDER — REPATHA SURECLICK 140 MG/ML ~~LOC~~ SOAJ: 140.0000 mg | SUBCUTANEOUS | 6 refills | Status: DC

## 2023-01-21 ENCOUNTER — Other Ambulatory Visit: Payer: Self-pay | Admitting: Internal Medicine

## 2023-01-22 ENCOUNTER — Other Ambulatory Visit: Payer: Self-pay | Admitting: Internal Medicine

## 2023-01-29 ENCOUNTER — Other Ambulatory Visit: Payer: Self-pay | Admitting: Family

## 2023-02-15 ENCOUNTER — Other Ambulatory Visit: Payer: Self-pay | Admitting: Internal Medicine

## 2023-02-15 NOTE — Telephone Encounter (Signed)
Spoke with patient who verbalized understanding medication change and to keep appointment.

## 2023-02-16 ENCOUNTER — Other Ambulatory Visit: Payer: Self-pay | Admitting: Internal Medicine

## 2023-02-25 ENCOUNTER — Other Ambulatory Visit: Payer: Self-pay | Admitting: Internal Medicine

## 2023-03-12 ENCOUNTER — Encounter: Payer: Self-pay | Admitting: Internal Medicine

## 2023-03-17 ENCOUNTER — Encounter: Payer: Self-pay | Admitting: Internal Medicine

## 2023-03-25 ENCOUNTER — Ambulatory Visit (HOSPITAL_BASED_OUTPATIENT_CLINIC_OR_DEPARTMENT_OTHER): Payer: Medicare Other | Admitting: Physical Therapy

## 2023-04-08 ENCOUNTER — Other Ambulatory Visit: Payer: Self-pay | Admitting: Internal Medicine

## 2023-04-08 ENCOUNTER — Other Ambulatory Visit: Payer: Medicare Other

## 2023-04-08 DIAGNOSIS — I10 Essential (primary) hypertension: Secondary | ICD-10-CM

## 2023-04-08 DIAGNOSIS — G729 Myopathy, unspecified: Secondary | ICD-10-CM

## 2023-04-08 DIAGNOSIS — E782 Mixed hyperlipidemia: Secondary | ICD-10-CM

## 2023-04-09 ENCOUNTER — Ambulatory Visit: Payer: Medicare Other | Admitting: Internal Medicine

## 2023-04-09 ENCOUNTER — Encounter: Payer: Self-pay | Admitting: Internal Medicine

## 2023-04-09 VITALS — BP 130/82 | HR 96 | Ht 63.0 in | Wt 211.4 lb

## 2023-04-09 DIAGNOSIS — M791 Myalgia, unspecified site: Secondary | ICD-10-CM | POA: Diagnosis not present

## 2023-04-09 DIAGNOSIS — I10 Essential (primary) hypertension: Secondary | ICD-10-CM | POA: Diagnosis not present

## 2023-04-09 DIAGNOSIS — G729 Myopathy, unspecified: Secondary | ICD-10-CM

## 2023-04-09 DIAGNOSIS — E7801 Familial hypercholesterolemia: Secondary | ICD-10-CM

## 2023-04-09 DIAGNOSIS — K219 Gastro-esophageal reflux disease without esophagitis: Secondary | ICD-10-CM

## 2023-04-09 DIAGNOSIS — E782 Mixed hyperlipidemia: Secondary | ICD-10-CM

## 2023-04-09 DIAGNOSIS — F4310 Post-traumatic stress disorder, unspecified: Secondary | ICD-10-CM

## 2023-04-09 DIAGNOSIS — E78019 Familial hypercholesterolemia, unspecified: Secondary | ICD-10-CM | POA: Insufficient documentation

## 2023-04-09 LAB — CMP14+EGFR
ALT: 45 IU/L — ABNORMAL HIGH (ref 0–32)
AST: 36 IU/L (ref 0–40)
Albumin: 4.2 g/dL (ref 3.8–4.9)
Alkaline Phosphatase: 70 IU/L (ref 44–121)
BUN/Creatinine Ratio: 21 (ref 9–23)
BUN: 16 mg/dL (ref 6–24)
Bilirubin Total: 0.5 mg/dL (ref 0.0–1.2)
CO2: 26 mmol/L (ref 20–29)
Calcium: 10.3 mg/dL — ABNORMAL HIGH (ref 8.7–10.2)
Chloride: 100 mmol/L (ref 96–106)
Creatinine, Ser: 0.78 mg/dL (ref 0.57–1.00)
Globulin, Total: 2.5 g/dL (ref 1.5–4.5)
Glucose: 94 mg/dL (ref 70–99)
Potassium: 4.4 mmol/L (ref 3.5–5.2)
Sodium: 143 mmol/L (ref 134–144)
Total Protein: 6.7 g/dL (ref 6.0–8.5)
eGFR: 91 mL/min/{1.73_m2} (ref >59)

## 2023-04-09 LAB — CBC WITH DIFFERENTIAL/PLATELET
Basophils Absolute: 0 10*3/uL (ref 0.0–0.2)
Basos: 0 %
EOS (ABSOLUTE): 0 10*3/uL (ref 0.0–0.4)
Eos: 0 %
Hematocrit: 36.4 % (ref 34.0–46.6)
Hemoglobin: 12.2 g/dL (ref 11.1–15.9)
Immature Grans (Abs): 0.1 10*3/uL (ref 0.0–0.1)
Immature Granulocytes: 1 %
Lymphocytes Absolute: 1.4 10*3/uL (ref 0.7–3.1)
Lymphs: 16 %
MCH: 29.8 pg (ref 26.6–33.0)
MCHC: 33.5 g/dL (ref 31.5–35.7)
MCV: 89 fL (ref 79–97)
Monocytes Absolute: 0.5 10*3/uL (ref 0.1–0.9)
Monocytes: 6 %
Neutrophils Absolute: 7 10*3/uL (ref 1.4–7.0)
Neutrophils: 77 %
Platelets: 306 10*3/uL (ref 150–450)
RBC: 4.1 x10E6/uL (ref 3.77–5.28)
RDW: 14.7 % (ref 11.7–15.4)
WBC: 9.1 10*3/uL (ref 3.4–10.8)

## 2023-04-09 LAB — LIPID PANEL W/O CHOL/HDL RATIO
Cholesterol, Total: 139 mg/dL (ref 100–199)
HDL: 65 mg/dL (ref >39)
LDL Chol Calc (NIH): 58 mg/dL (ref 0–99)
Triglycerides: 82 mg/dL (ref 0–149)
VLDL Cholesterol Cal: 16 mg/dL (ref 5–40)

## 2023-04-09 LAB — CK: Total CK: 32 U/L (ref 32–182)
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
## 2023-04-26 ENCOUNTER — Other Ambulatory Visit: Payer: Self-pay | Admitting: Family

## 2023-06-10 ENCOUNTER — Ambulatory Visit: Payer: Medicare Other | Admitting: Internal Medicine

## 2023-06-22 ENCOUNTER — Encounter: Payer: Self-pay | Admitting: Internal Medicine

## 2023-06-22 ENCOUNTER — Other Ambulatory Visit: Payer: Self-pay | Admitting: Internal Medicine

## 2023-06-22 ENCOUNTER — Telehealth: Payer: Self-pay | Admitting: Internal Medicine

## 2023-06-22 DIAGNOSIS — D0511 Intraductal carcinoma in situ of right breast: Secondary | ICD-10-CM

## 2023-06-22 DIAGNOSIS — C50911 Malignant neoplasm of unspecified site of right female breast: Secondary | ICD-10-CM

## 2023-06-23 ENCOUNTER — Other Ambulatory Visit: Payer: Self-pay | Admitting: Internal Medicine

## 2023-06-23 ENCOUNTER — Ambulatory Visit: Payer: Medicare Other | Admitting: Cardiology

## 2023-06-23 NOTE — Telephone Encounter (Signed)
Entered in error

## 2023-06-24 ENCOUNTER — Other Ambulatory Visit: Payer: Self-pay | Admitting: Internal Medicine

## 2023-06-24 DIAGNOSIS — J45990 Exercise induced bronchospasm: Secondary | ICD-10-CM

## 2023-06-25 ENCOUNTER — Other Ambulatory Visit: Payer: Self-pay | Admitting: Internal Medicine

## 2023-06-25 ENCOUNTER — Encounter: Payer: Self-pay | Admitting: Internal Medicine

## 2023-06-25 DIAGNOSIS — J4541 Moderate persistent asthma with (acute) exacerbation: Secondary | ICD-10-CM

## 2023-06-25 MED ORDER — PREDNISONE 20 MG PO TABS: 40.0000 mg | ORAL_TABLET | Freq: Every day | ORAL | 0 refills | Status: DC

## 2023-07-08 ENCOUNTER — Other Ambulatory Visit: Payer: Self-pay | Admitting: Internal Medicine

## 2023-07-08 ENCOUNTER — Other Ambulatory Visit: Payer: Medicare Other

## 2023-07-08 DIAGNOSIS — I10 Essential (primary) hypertension: Secondary | ICD-10-CM

## 2023-07-08 DIAGNOSIS — E782 Mixed hyperlipidemia: Secondary | ICD-10-CM

## 2023-07-09 ENCOUNTER — Ambulatory Visit: Payer: Medicare Other | Admitting: Internal Medicine

## 2023-07-09 ENCOUNTER — Encounter: Payer: Self-pay | Admitting: Internal Medicine

## 2023-07-09 VITALS — BP 130/90 | HR 99 | Ht 63.0 in | Wt 224.4 lb

## 2023-07-09 DIAGNOSIS — K219 Gastro-esophageal reflux disease without esophagitis: Secondary | ICD-10-CM

## 2023-07-09 DIAGNOSIS — I10 Essential (primary) hypertension: Secondary | ICD-10-CM | POA: Diagnosis not present

## 2023-07-09 DIAGNOSIS — E782 Mixed hyperlipidemia: Secondary | ICD-10-CM | POA: Diagnosis not present

## 2023-07-09 DIAGNOSIS — Z9189 Other specified personal risk factors, not elsewhere classified: Secondary | ICD-10-CM | POA: Diagnosis not present

## 2023-07-09 DIAGNOSIS — M797 Fibromyalgia: Secondary | ICD-10-CM

## 2023-07-09 LAB — LIPID PANEL
Chol/HDL Ratio: 3.1 {ratio} (ref 0.0–4.4)
Cholesterol, Total: 143 mg/dL (ref 100–199)
HDL: 46 mg/dL (ref >39)
LDL Chol Calc (NIH): 76 mg/dL (ref 0–99)
Triglycerides: 115 mg/dL (ref 0–149)
VLDL Cholesterol Cal: 21 mg/dL (ref 5–40)

## 2023-07-09 LAB — CMP14+EGFR
ALT: 30 [IU]/L (ref 0–32)
AST: 35 [IU]/L (ref 0–40)
Albumin: 4.2 g/dL (ref 3.8–4.9)
Alkaline Phosphatase: 88 [IU]/L (ref 44–121)
BUN/Creatinine Ratio: 10 (ref 9–23)
BUN: 8 mg/dL (ref 6–24)
Bilirubin Total: 0.4 mg/dL (ref 0.0–1.2)
CO2: 27 mmol/L (ref 20–29)
Calcium: 9.6 mg/dL (ref 8.7–10.2)
Chloride: 99 mmol/L (ref 96–106)
Creatinine, Ser: 0.79 mg/dL (ref 0.57–1.00)
Globulin, Total: 2.5 g/dL (ref 1.5–4.5)
Glucose: 97 mg/dL (ref 70–99)
Potassium: 4.2 mmol/L (ref 3.5–5.2)
Sodium: 141 mmol/L (ref 134–144)
Total Protein: 6.7 g/dL (ref 6.0–8.5)
eGFR: 90 mL/min/{1.73_m2} (ref >59)

## 2023-07-09 LAB — CBC WITH DIFF/PLATELET
Basophils Absolute: 0.1 10*3/uL (ref 0.0–0.2)
Basos: 1 %
EOS (ABSOLUTE): 0.2 10*3/uL (ref 0.0–0.4)
Eos: 3 %
Hematocrit: 38.6 % (ref 34.0–46.6)
Hemoglobin: 12.2 g/dL (ref 11.1–15.9)
Immature Grans (Abs): 0.1 10*3/uL (ref 0.0–0.1)
Immature Granulocytes: 1 %
Lymphocytes Absolute: 1.9 10*3/uL (ref 0.7–3.1)
Lymphs: 27 %
MCH: 28.3 pg (ref 26.6–33.0)
MCHC: 31.6 g/dL (ref 31.5–35.7)
MCV: 90 fL (ref 79–97)
Monocytes Absolute: 0.6 10*3/uL (ref 0.1–0.9)
Monocytes: 9 %
Neutrophils Absolute: 4.4 10*3/uL (ref 1.4–7.0)
Neutrophils: 59 %
Platelets: 318 10*3/uL (ref 150–450)
RBC: 4.31 x10E6/uL (ref 3.77–5.28)
RDW: 14 % (ref 11.7–15.4)
WBC: 7.2 10*3/uL (ref 3.4–10.8)

## 2023-07-09 LAB — CK: Total CK: 33 U/L (ref 32–182)
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
## 2023-07-22 ENCOUNTER — Other Ambulatory Visit: Payer: Self-pay | Admitting: Family

## 2023-07-22 DIAGNOSIS — D0511 Intraductal carcinoma in situ of right breast: Secondary | ICD-10-CM

## 2023-07-22 DIAGNOSIS — C50911 Malignant neoplasm of unspecified site of right female breast: Secondary | ICD-10-CM

## 2023-07-25 ENCOUNTER — Other Ambulatory Visit: Payer: Self-pay | Admitting: Internal Medicine

## 2023-08-04 ENCOUNTER — Encounter: Payer: Self-pay | Admitting: Internal Medicine

## 2023-08-05 ENCOUNTER — Other Ambulatory Visit: Payer: Self-pay | Admitting: Internal Medicine

## 2023-08-05 DIAGNOSIS — R21 Rash and other nonspecific skin eruption: Secondary | ICD-10-CM

## 2023-08-09 ENCOUNTER — Ambulatory Visit: Payer: Medicare Other | Admitting: Internal Medicine

## 2023-08-12 ENCOUNTER — Other Ambulatory Visit: Payer: Self-pay | Admitting: Internal Medicine

## 2023-08-12 DIAGNOSIS — K581 Irritable bowel syndrome with constipation: Secondary | ICD-10-CM

## 2023-08-13 MED ORDER — LINACLOTIDE 145 MCG PO CAPS: 145.0000 ug | ORAL_CAPSULE | Freq: Every day | ORAL | 1 refills | Status: AC | PRN

## 2023-08-17 ENCOUNTER — Ambulatory Visit: Payer: Medicare Other | Admitting: Internal Medicine

## 2023-08-17 ENCOUNTER — Encounter: Payer: Self-pay | Admitting: Internal Medicine

## 2023-08-17 VITALS — BP 122/84 | HR 85 | Ht 63.0 in | Wt 225.0 lb

## 2023-08-17 DIAGNOSIS — F411 Generalized anxiety disorder: Secondary | ICD-10-CM

## 2023-08-17 DIAGNOSIS — F4310 Post-traumatic stress disorder, unspecified: Secondary | ICD-10-CM

## 2023-08-17 DIAGNOSIS — I10 Essential (primary) hypertension: Secondary | ICD-10-CM

## 2023-08-17 DIAGNOSIS — E782 Mixed hyperlipidemia: Secondary | ICD-10-CM

## 2023-08-17 DIAGNOSIS — J4541 Moderate persistent asthma with (acute) exacerbation: Secondary | ICD-10-CM

## 2023-08-17 DIAGNOSIS — F329 Major depressive disorder, single episode, unspecified: Secondary | ICD-10-CM

## 2023-08-17 DIAGNOSIS — K219 Gastro-esophageal reflux disease without esophagitis: Secondary | ICD-10-CM | POA: Diagnosis not present

## 2023-08-17 DIAGNOSIS — M797 Fibromyalgia: Secondary | ICD-10-CM

## 2023-08-17 MED ORDER — VRAYLAR 1.5 MG PO CAPS: 1.5000 mg | ORAL_CAPSULE | Freq: Every day | ORAL | 3 refills | Status: DC

## 2023-08-17 MED ORDER — SERTRALINE HCL 100 MG PO TABS: 200.0000 mg | ORAL_TABLET | Freq: Every day | ORAL | 3 refills | Status: DC

## 2023-08-17 MED ORDER — CYCLOBENZAPRINE HCL 10 MG PO TABS: 10.0000 mg | ORAL_TABLET | Freq: Every day | ORAL | 5 refills | Status: DC

## 2023-08-17 MED ORDER — GABAPENTIN 600 MG PO TABS: 600.0000 mg | ORAL_TABLET | Freq: Two times a day (BID) | ORAL | 3 refills | Status: AC
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
## 2023-08-28 ENCOUNTER — Other Ambulatory Visit: Payer: Self-pay | Admitting: Internal Medicine

## 2023-09-02 ENCOUNTER — Telehealth: Payer: Self-pay | Admitting: Internal Medicine

## 2023-09-02 ENCOUNTER — Other Ambulatory Visit: Payer: Self-pay | Admitting: Internal Medicine

## 2023-09-02 DIAGNOSIS — G894 Chronic pain syndrome: Secondary | ICD-10-CM

## 2023-09-02 NOTE — Telephone Encounter (Signed)
Patient left VM requesting a referral be sent to Dr. Cherylann Ratel in pain management. She is currently seeing Dr. Ronita Hipps but wants to switch and the referral must come from her PCP.

## 2023-09-19 ENCOUNTER — Other Ambulatory Visit: Payer: Self-pay | Admitting: Internal Medicine

## 2023-09-19 DIAGNOSIS — E7801 Familial hypercholesterolemia: Secondary | ICD-10-CM

## 2023-09-22 ENCOUNTER — Encounter: Payer: Self-pay | Admitting: Internal Medicine

## 2023-10-11 ENCOUNTER — Encounter: Payer: Self-pay | Admitting: Internal Medicine

## 2023-10-29 ENCOUNTER — Other Ambulatory Visit: Payer: Self-pay | Admitting: Internal Medicine

## 2023-11-13 ENCOUNTER — Encounter: Payer: Self-pay | Admitting: Internal Medicine

## 2023-11-15 ENCOUNTER — Ambulatory Visit: Payer: Medicare Other | Admitting: Internal Medicine

## 2023-11-15 ENCOUNTER — Other Ambulatory Visit

## 2023-11-15 ENCOUNTER — Encounter: Payer: Self-pay | Admitting: Internal Medicine

## 2023-11-15 VITALS — BP 130/84 | HR 89 | Ht 63.0 in | Wt 236.8 lb

## 2023-11-15 DIAGNOSIS — I10 Essential (primary) hypertension: Secondary | ICD-10-CM

## 2023-11-15 DIAGNOSIS — G894 Chronic pain syndrome: Secondary | ICD-10-CM | POA: Diagnosis not present

## 2023-11-15 DIAGNOSIS — R7301 Impaired fasting glucose: Secondary | ICD-10-CM

## 2023-11-15 DIAGNOSIS — E782 Mixed hyperlipidemia: Secondary | ICD-10-CM | POA: Diagnosis not present

## 2023-11-15 DIAGNOSIS — J4540 Moderate persistent asthma, uncomplicated: Secondary | ICD-10-CM

## 2023-11-15 DIAGNOSIS — E7801 Familial hypercholesterolemia: Secondary | ICD-10-CM

## 2023-11-15 DIAGNOSIS — E669 Obesity, unspecified: Secondary | ICD-10-CM

## 2023-11-15 DIAGNOSIS — M797 Fibromyalgia: Secondary | ICD-10-CM

## 2023-11-15 DIAGNOSIS — K219 Gastro-esophageal reflux disease without esophagitis: Secondary | ICD-10-CM

## 2023-11-15 MED ORDER — LEVOFLOXACIN 500 MG PO TABS: 500.0000 mg | ORAL_TABLET | Freq: Every day | ORAL | 0 refills | Status: AC

## 2023-11-15 MED ORDER — METHYLPREDNISOLONE 4 MG PO TBPK: ORAL_TABLET | ORAL | 0 refills | Status: DC
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
## 2023-11-16 LAB — LIPID PANEL
Chol/HDL Ratio: 3.8 ratio (ref 0.0–4.4)
Cholesterol, Total: 107 mg/dL (ref 100–199)
HDL: 28 mg/dL — ABNORMAL LOW (ref >39)
LDL Chol Calc (NIH): 51 mg/dL (ref 0–99)
Triglycerides: 167 mg/dL — ABNORMAL HIGH (ref 0–149)
VLDL Cholesterol Cal: 28 mg/dL (ref 5–40)

## 2023-11-16 LAB — CMP14+EGFR
ALT: 28 IU/L (ref 0–32)
AST: 34 IU/L (ref 0–40)
Albumin: 4.2 g/dL (ref 3.8–4.9)
Alkaline Phosphatase: 101 IU/L (ref 44–121)
BUN/Creatinine Ratio: 17 (ref 9–23)
BUN: 12 mg/dL (ref 6–24)
Bilirubin Total: 0.3 mg/dL (ref 0.0–1.2)
CO2: 25 mmol/L (ref 20–29)
Calcium: 9.4 mg/dL (ref 8.7–10.2)
Chloride: 101 mmol/L (ref 96–106)
Creatinine, Ser: 0.72 mg/dL (ref 0.57–1.00)
Globulin, Total: 2.6 g/dL (ref 1.5–4.5)
Glucose: 111 mg/dL — ABNORMAL HIGH (ref 70–99)
Potassium: 4 mmol/L (ref 3.5–5.2)
Sodium: 141 mmol/L (ref 134–144)
Total Protein: 6.8 g/dL (ref 6.0–8.5)
eGFR: 100 mL/min/{1.73_m2} (ref >59)

## 2023-11-16 LAB — HEMOGLOBIN A1C
Est. average glucose Bld gHb Est-mCnc: 126 mg/dL
Hgb A1c MFr Bld: 6 % — ABNORMAL HIGH (ref 4.8–5.6)

## 2023-11-16 LAB — TSH: TSH: 2.36 u[IU]/mL (ref 0.450–4.500)

## 2023-11-22 NOTE — Progress Notes (Signed)
 Patient notified

## 2023-11-26 ENCOUNTER — Other Ambulatory Visit: Payer: Self-pay

## 2023-11-26 ENCOUNTER — Encounter: Payer: Self-pay | Admitting: Internal Medicine

## 2023-11-26 DIAGNOSIS — D0511 Intraductal carcinoma in situ of right breast: Secondary | ICD-10-CM

## 2023-11-26 DIAGNOSIS — C50911 Malignant neoplasm of unspecified site of right female breast: Secondary | ICD-10-CM

## 2023-11-26 DIAGNOSIS — E7801 Familial hypercholesterolemia: Secondary | ICD-10-CM

## 2023-11-26 MED ORDER — NEXLIZET 180-10 MG PO TABS: 1.0000 | ORAL_TABLET | Freq: Every day | ORAL | 6 refills | Status: AC

## 2023-11-26 MED ORDER — MONTELUKAST SODIUM 10 MG PO TABS: 10.0000 mg | ORAL_TABLET | Freq: Every day | ORAL | 3 refills | Status: AC

## 2023-12-09 ENCOUNTER — Other Ambulatory Visit: Payer: Self-pay | Admitting: Internal Medicine

## 2023-12-15 ENCOUNTER — Other Ambulatory Visit: Payer: Self-pay | Admitting: Family

## 2024-01-07 ENCOUNTER — Ambulatory Visit: Payer: Medicare Other | Admitting: Internal Medicine

## 2024-02-15 ENCOUNTER — Other Ambulatory Visit: Payer: Self-pay | Admitting: Specialist

## 2024-02-15 DIAGNOSIS — J4550 Severe persistent asthma, uncomplicated: Secondary | ICD-10-CM

## 2024-02-15 DIAGNOSIS — J849 Interstitial pulmonary disease, unspecified: Secondary | ICD-10-CM

## 2024-02-15 DIAGNOSIS — R058 Other specified cough: Secondary | ICD-10-CM

## 2024-02-16 ENCOUNTER — Ambulatory Visit
Admission: RE | Admit: 2024-02-16 | Discharge: 2024-02-16 | Disposition: A | Discharge status: Home / Self Care | Source: Ambulatory Visit | Attending: Specialist | Admitting: Specialist

## 2024-02-16 DIAGNOSIS — J4550 Severe persistent asthma, uncomplicated: Secondary | ICD-10-CM | POA: Insufficient documentation

## 2024-02-16 DIAGNOSIS — J849 Interstitial pulmonary disease, unspecified: Secondary | ICD-10-CM | POA: Insufficient documentation

## 2024-02-16 DIAGNOSIS — R058 Other specified cough: Secondary | ICD-10-CM | POA: Diagnosis present

## 2024-02-25 ENCOUNTER — Other Ambulatory Visit: Payer: Self-pay | Admitting: Internal Medicine

## 2024-02-25 DIAGNOSIS — F411 Generalized anxiety disorder: Secondary | ICD-10-CM

## 2024-03-16 ENCOUNTER — Ambulatory Visit: Admitting: Internal Medicine

## 2024-04-06 ENCOUNTER — Other Ambulatory Visit: Payer: Self-pay | Admitting: Internal Medicine

## 2024-04-06 ENCOUNTER — Encounter: Payer: Self-pay | Admitting: Internal Medicine

## 2024-04-06 DIAGNOSIS — Z85828 Personal history of other malignant neoplasm of skin: Secondary | ICD-10-CM

## 2024-04-10 ENCOUNTER — Other Ambulatory Visit

## 2024-04-10 DIAGNOSIS — R7301 Impaired fasting glucose: Secondary | ICD-10-CM

## 2024-04-10 DIAGNOSIS — E782 Mixed hyperlipidemia: Secondary | ICD-10-CM

## 2024-04-10 DIAGNOSIS — E7801 Familial hypercholesterolemia: Secondary | ICD-10-CM

## 2024-04-10 DIAGNOSIS — E669 Obesity, unspecified: Secondary | ICD-10-CM

## 2024-04-10 DIAGNOSIS — I10 Essential (primary) hypertension: Secondary | ICD-10-CM

## 2024-04-11 ENCOUNTER — Ambulatory Visit: Payer: Self-pay | Admitting: Internal Medicine

## 2024-04-11 LAB — CMP14+EGFR
ALT: 41 IU/L — ABNORMAL HIGH (ref 0–32)
AST: 58 IU/L — ABNORMAL HIGH (ref 0–40)
Albumin: 4.1 g/dL (ref 3.8–4.9)
Alkaline Phosphatase: 82 IU/L (ref 44–121)
BUN/Creatinine Ratio: 10 (ref 9–23)
BUN: 8 mg/dL (ref 6–24)
Bilirubin Total: 0.4 mg/dL (ref 0.0–1.2)
CO2: 22 mmol/L (ref 20–29)
Calcium: 9.1 mg/dL (ref 8.7–10.2)
Chloride: 100 mmol/L (ref 96–106)
Creatinine, Ser: 0.79 mg/dL (ref 0.57–1.00)
Globulin, Total: 2.4 g/dL (ref 1.5–4.5)
Glucose: 94 mg/dL (ref 70–99)
Potassium: 4.1 mmol/L (ref 3.5–5.2)
Sodium: 138 mmol/L (ref 134–144)
Total Protein: 6.5 g/dL (ref 6.0–8.5)
eGFR: 89 mL/min/1.73 (ref >59)

## 2024-04-11 LAB — LIPID PANEL
Chol/HDL Ratio: 3.1 ratio (ref 0.0–4.4)
Cholesterol, Total: 111 mg/dL (ref 100–199)
HDL: 36 mg/dL — ABNORMAL LOW (ref >39)
LDL Chol Calc (NIH): 47 mg/dL (ref 0–99)
Triglycerides: 167 mg/dL — ABNORMAL HIGH (ref 0–149)
VLDL Cholesterol Cal: 28 mg/dL (ref 5–40)

## 2024-04-11 LAB — HEMOGLOBIN A1C
Est. average glucose Bld gHb Est-mCnc: 123 mg/dL
Hgb A1c MFr Bld: 5.9 % — ABNORMAL HIGH (ref 4.8–5.6)

## 2024-04-11 LAB — TSH: TSH: 2.46 u[IU]/mL (ref 0.450–4.500)

## 2024-04-13 ENCOUNTER — Ambulatory Visit: Admitting: Internal Medicine

## 2024-04-17 ENCOUNTER — Ambulatory Visit: Admitting: Internal Medicine

## 2024-04-17 ENCOUNTER — Encounter: Payer: Self-pay | Admitting: Internal Medicine

## 2024-04-17 VITALS — BP 124/78 | HR 97 | Ht 63.0 in | Wt 232.2 lb

## 2024-04-17 DIAGNOSIS — J4540 Moderate persistent asthma, uncomplicated: Secondary | ICD-10-CM | POA: Diagnosis not present

## 2024-04-17 DIAGNOSIS — K581 Irritable bowel syndrome with constipation: Secondary | ICD-10-CM

## 2024-04-17 DIAGNOSIS — M797 Fibromyalgia: Secondary | ICD-10-CM | POA: Diagnosis not present

## 2024-04-17 DIAGNOSIS — J45909 Unspecified asthma, uncomplicated: Secondary | ICD-10-CM | POA: Insufficient documentation

## 2024-04-17 DIAGNOSIS — I10 Essential (primary) hypertension: Secondary | ICD-10-CM | POA: Diagnosis not present

## 2024-04-17 DIAGNOSIS — E782 Mixed hyperlipidemia: Secondary | ICD-10-CM

## 2024-04-17 DIAGNOSIS — G4733 Obstructive sleep apnea (adult) (pediatric): Secondary | ICD-10-CM | POA: Insufficient documentation

## 2024-04-17 DIAGNOSIS — Z1211 Encounter for screening for malignant neoplasm of colon: Secondary | ICD-10-CM | POA: Insufficient documentation

## 2024-04-17 DIAGNOSIS — R7303 Prediabetes: Secondary | ICD-10-CM

## 2024-04-17 DIAGNOSIS — Z6841 Body Mass Index (BMI) 40.0 and over, adult: Secondary | ICD-10-CM

## 2024-04-17 DIAGNOSIS — F329 Major depressive disorder, single episode, unspecified: Secondary | ICD-10-CM

## 2024-04-17 MED ORDER — TIRZEPATIDE-WEIGHT MANAGEMENT 2.5 MG/0.5ML ~~LOC~~ SOLN: 2.5000 mg | SUBCUTANEOUS | 0 refills | Status: DC
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
## 2024-04-18 ENCOUNTER — Encounter: Payer: Self-pay | Admitting: Internal Medicine

## 2024-04-20 ENCOUNTER — Other Ambulatory Visit: Payer: Self-pay | Admitting: Internal Medicine

## 2024-04-25 ENCOUNTER — Ambulatory Visit: Admitting: Internal Medicine

## 2024-04-26 ENCOUNTER — Ambulatory Visit: Admitting: Internal Medicine
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
## 2024-05-04 ENCOUNTER — Other Ambulatory Visit: Payer: Self-pay | Admitting: Medical Genetics

## 2024-05-10 ENCOUNTER — Other Ambulatory Visit

## 2024-05-11 ENCOUNTER — Encounter: Payer: Self-pay | Admitting: Internal Medicine

## 2024-05-11 ENCOUNTER — Other Ambulatory Visit
Admission: RE | Admit: 2024-05-11 | Discharge: 2024-05-11 | Disposition: A | Source: Ambulatory Visit | Attending: Internal Medicine | Admitting: Internal Medicine

## 2024-05-11 ENCOUNTER — Ambulatory Visit (INDEPENDENT_AMBULATORY_CARE_PROVIDER_SITE_OTHER): Admitting: Internal Medicine

## 2024-05-11 ENCOUNTER — Other Ambulatory Visit
Admission: RE | Admit: 2024-05-11 | Discharge: 2024-05-11 | Disposition: A | Discharge status: Home / Self Care | Source: Ambulatory Visit | Attending: Medical Genetics | Admitting: Medical Genetics

## 2024-05-11 VITALS — BP 110/80 | HR 85 | Temp 98.6°F | Ht 63.0 in | Wt 227.6 lb

## 2024-05-11 DIAGNOSIS — R053 Chronic cough: Secondary | ICD-10-CM | POA: Diagnosis present

## 2024-05-11 DIAGNOSIS — G4733 Obstructive sleep apnea (adult) (pediatric): Secondary | ICD-10-CM

## 2024-05-11 DIAGNOSIS — Z6841 Body Mass Index (BMI) 40.0 and over, adult: Secondary | ICD-10-CM | POA: Diagnosis not present

## 2024-05-11 DIAGNOSIS — J45909 Unspecified asthma, uncomplicated: Secondary | ICD-10-CM

## 2024-05-11 LAB — CBC WITH DIFFERENTIAL/PLATELET
Abs Immature Granulocytes: 0.03 K/uL (ref 0.00–0.07)
Basophils Absolute: 0.1 K/uL (ref 0.0–0.1)
Basophils Relative: 1 %
Eosinophils Absolute: 0.3 K/uL (ref 0.0–0.5)
Eosinophils Relative: 4 %
HCT: 35.6 % — ABNORMAL LOW (ref 36.0–46.0)
Hemoglobin: 11.5 g/dL — ABNORMAL LOW (ref 12.0–15.0)
Immature Granulocytes: 1 %
Lymphocytes Relative: 26 %
Lymphs Abs: 1.5 K/uL (ref 0.7–4.0)
MCH: 27.3 pg (ref 26.0–34.0)
MCHC: 32.3 g/dL (ref 30.0–36.0)
MCV: 84.4 fL (ref 80.0–100.0)
Monocytes Absolute: 0.6 K/uL (ref 0.1–1.0)
Monocytes Relative: 10 %
Neutro Abs: 3.5 K/uL (ref 1.7–7.7)
Neutrophils Relative %: 58 %
Platelets: 275 K/uL (ref 150–400)
RBC: 4.22 MIL/uL (ref 3.87–5.11)
RDW: 16.1 % — ABNORMAL HIGH (ref 11.5–15.5)
WBC: 6 K/uL (ref 4.0–10.5)
nRBC: 0 % (ref 0.0–0.2)

## 2024-05-11 LAB — NITRIC OXIDE: Nitric Oxide: 10
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
## 2024-05-11 NOTE — Patient Instructions (Addendum)
 Recommend obtaining IgE levels Recommend obtaining CBC with differential Recommend obtaining pulmonary function testing to assess lung function Recommend using incentive spirometry 10 times per day Recommend using flutter valve 10 times per day  We are planning to assess for biological agents therapy for your asthma  Avoid Allergens and Irritants Avoid secondhand smoke Avoid SICK contacts Recommend  Masking  when appropriate Recommend Keep up-to-date with vaccinations  Recommend weight loss Continue Protonix  Continue inhalers as prescribed Continue nebulizers as prescribed

## 2024-05-13 LAB — IGE: IgE (Immunoglobulin E), Serum: 428 [IU]/mL (ref 6–495)

## 2024-05-18 ENCOUNTER — Ambulatory Visit: Admitting: Internal Medicine

## 2024-05-22 LAB — GENECONNECT MOLECULAR SCREEN: Genetic Analysis Overall Interpretation: NEGATIVE

## 2024-05-23 ENCOUNTER — Ambulatory Visit: Admitting: Internal Medicine

## 2024-05-26 ENCOUNTER — Ambulatory Visit: Admitting: Internal Medicine

## 2024-05-31 ENCOUNTER — Ambulatory Visit: Admitting: Pulmonary Disease

## 2024-06-06 ENCOUNTER — Ambulatory Visit: Admitting: Internal Medicine

## 2024-06-08 ENCOUNTER — Other Ambulatory Visit: Payer: Self-pay | Admitting: Internal Medicine

## 2024-06-12 ENCOUNTER — Other Ambulatory Visit

## 2024-06-14 ENCOUNTER — Ambulatory Visit: Admitting: Internal Medicine

## 2024-06-22 ENCOUNTER — Encounter: Payer: Self-pay | Admitting: Internal Medicine

## 2024-06-22 DIAGNOSIS — Z888 Allergy status to other drugs, medicaments and biological substances status: Secondary | ICD-10-CM | POA: Insufficient documentation

## 2024-06-22 DIAGNOSIS — E782 Mixed hyperlipidemia: Secondary | ICD-10-CM

## 2024-06-23 ENCOUNTER — Other Ambulatory Visit: Payer: Self-pay | Admitting: Internal Medicine

## 2024-06-23 DIAGNOSIS — M797 Fibromyalgia: Secondary | ICD-10-CM

## 2024-07-11 ENCOUNTER — Telehealth: Payer: Self-pay

## 2024-07-11 NOTE — Progress Notes (Signed)
   07/11/2024  Patient ID: [name], female   DOB: Dec 03, 1970, 53 y.o.   MRN: [id number]  Contacted patient to discuss assistance for Nexlizet . Patient qualifies for and is APPROVED to get Nexlizet  through the [facility] through 06/11/2025.  Explained to patient that this is a coupon card that helps with copay card and does go through insurance first. She expressed understanding.  Uploading coupon card here for reference and sending info to patient via mychart.    Jon VEAR Lindau, PharmD Clinical Pharmacist [phone]
